# Patient Record
Sex: Female | Born: 1960 | Race: White | Hispanic: No | Smoking: Never smoker
Health system: Southern US, Community
[De-identification: ages and names within clinical notes are randomized; demographics above are authoritative.]

## PROBLEM LIST (undated history)

## (undated) DIAGNOSIS — R569 Unspecified convulsions: Secondary | ICD-10-CM

## (undated) HISTORY — PX: REPLACEMENT TOTAL KNEE BILATERAL: SUR1225

---

## 2020-08-13 ENCOUNTER — Other Ambulatory Visit (HOSPITAL_COMMUNITY): Payer: Self-pay | Admitting: *Deleted

## 2020-08-13 DIAGNOSIS — Z1231 Encounter for screening mammogram for malignant neoplasm of breast: Secondary | ICD-10-CM

## 2020-08-15 ENCOUNTER — Ambulatory Visit (HOSPITAL_COMMUNITY)
Admission: RE | Admit: 2020-08-15 | Discharge: 2020-08-15 | Disposition: A | Discharge status: Home / Self Care | Payer: Self-pay | Source: Ambulatory Visit | Attending: *Deleted | Admitting: *Deleted

## 2020-08-15 ENCOUNTER — Other Ambulatory Visit: Payer: Self-pay

## 2020-08-15 DIAGNOSIS — Z1231 Encounter for screening mammogram for malignant neoplasm of breast: Secondary | ICD-10-CM | POA: Insufficient documentation

## 2020-08-22 ENCOUNTER — Encounter: Payer: Self-pay | Admitting: Internal Medicine

## 2020-09-11 ENCOUNTER — Ambulatory Visit: Payer: Self-pay

## 2021-07-10 ENCOUNTER — Other Ambulatory Visit (HOSPITAL_COMMUNITY): Payer: Self-pay | Admitting: *Deleted

## 2021-07-24 ENCOUNTER — Other Ambulatory Visit (HOSPITAL_COMMUNITY): Payer: Self-pay | Admitting: Nurse Practitioner

## 2021-07-24 DIAGNOSIS — Z1231 Encounter for screening mammogram for malignant neoplasm of breast: Secondary | ICD-10-CM

## 2021-08-12 ENCOUNTER — Other Ambulatory Visit (HOSPITAL_COMMUNITY): Payer: Self-pay | Admitting: Nurse Practitioner

## 2021-08-19 ENCOUNTER — Ambulatory Visit (HOSPITAL_COMMUNITY)
Admission: RE | Admit: 2021-08-19 | Discharge: 2021-08-19 | Disposition: A | Discharge status: Home / Self Care | Payer: Commercial Managed Care - PPO | Source: Ambulatory Visit | Attending: Nurse Practitioner | Admitting: Nurse Practitioner

## 2021-08-19 ENCOUNTER — Other Ambulatory Visit: Payer: Self-pay

## 2021-08-19 DIAGNOSIS — Z1231 Encounter for screening mammogram for malignant neoplasm of breast: Secondary | ICD-10-CM | POA: Diagnosis not present

## 2021-09-02 ENCOUNTER — Encounter (HOSPITAL_COMMUNITY): Payer: Self-pay | Admitting: Emergency Medicine

## 2021-09-02 ENCOUNTER — Inpatient Hospital Stay (HOSPITAL_COMMUNITY)
Admission: EM | Admit: 2021-09-02 | Discharge: 2021-09-03 | DRG: 101 | Disposition: A | Discharge status: Home / Self Care | Payer: Commercial Managed Care - PPO | Attending: Internal Medicine | Admitting: Internal Medicine

## 2021-09-02 ENCOUNTER — Emergency Department (HOSPITAL_COMMUNITY): Payer: Commercial Managed Care - PPO

## 2021-09-02 ENCOUNTER — Inpatient Hospital Stay (HOSPITAL_COMMUNITY)
Admit: 2021-09-02 | Discharge: 2021-09-02 | Disposition: A | Discharge status: Home / Self Care | Payer: Commercial Managed Care - PPO

## 2021-09-02 DIAGNOSIS — Z20822 Contact with and (suspected) exposure to covid-19: Secondary | ICD-10-CM | POA: Diagnosis present

## 2021-09-02 DIAGNOSIS — R739 Hyperglycemia, unspecified: Secondary | ICD-10-CM | POA: Diagnosis present

## 2021-09-02 DIAGNOSIS — G40909 Epilepsy, unspecified, not intractable, without status epilepticus: Principal | ICD-10-CM | POA: Diagnosis present

## 2021-09-02 DIAGNOSIS — R569 Unspecified convulsions: Secondary | ICD-10-CM

## 2021-09-02 HISTORY — DX: Unspecified convulsions: R56.9

## 2021-09-02 LAB — HEMOGLOBIN A1C
Hgb A1c MFr Bld: 5.1 % (ref 4.8–5.6)
Mean Plasma Glucose: 99.67 mg/dL

## 2021-09-02 LAB — CBC WITH DIFFERENTIAL/PLATELET
Abs Immature Granulocytes: 0.1 10*3/uL — ABNORMAL HIGH (ref 0.00–0.07)
Basophils Absolute: 0 10*3/uL (ref 0.0–0.1)
Basophils Relative: 0 %
Eosinophils Absolute: 0 10*3/uL (ref 0.0–0.5)
Eosinophils Relative: 0 %
HCT: 42.1 % (ref 36.0–46.0)
Hemoglobin: 13.7 g/dL (ref 12.0–15.0)
Immature Granulocytes: 2 %
Lymphocytes Relative: 21 %
Lymphs Abs: 1.5 10*3/uL (ref 0.7–4.0)
MCH: 29 pg (ref 26.0–34.0)
MCHC: 32.5 g/dL (ref 30.0–36.0)
MCV: 89.2 fL (ref 80.0–100.0)
Monocytes Absolute: 0.4 10*3/uL (ref 0.1–1.0)
Monocytes Relative: 6 %
Neutro Abs: 4.8 10*3/uL (ref 1.7–7.7)
Neutrophils Relative %: 71 %
Platelets: 221 10*3/uL (ref 150–400)
RBC: 4.72 MIL/uL (ref 3.87–5.11)
RDW: 12.7 % (ref 11.5–15.5)
WBC: 6.8 10*3/uL (ref 4.0–10.5)
nRBC: 0 % (ref 0.0–0.2)

## 2021-09-02 LAB — COMPREHENSIVE METABOLIC PANEL
ALT: 15 U/L (ref 0–44)
AST: 23 U/L (ref 15–41)
Albumin: 4.5 g/dL (ref 3.5–5.0)
Alkaline Phosphatase: 66 U/L (ref 38–126)
Anion gap: 9 (ref 5–15)
BUN: 23 mg/dL (ref 8–23)
CO2: 22 mmol/L (ref 22–32)
Calcium: 8.9 mg/dL (ref 8.9–10.3)
Chloride: 107 mmol/L (ref 98–111)
Creatinine, Ser: 0.68 mg/dL (ref 0.44–1.00)
GFR, Estimated: >60 mL/min (ref >60)
Glucose, Bld: 178 mg/dL — ABNORMAL HIGH (ref 70–99)
Potassium: 3.8 mmol/L (ref 3.5–5.1)
Sodium: 138 mmol/L (ref 135–145)
Total Bilirubin: 0.4 mg/dL (ref 0.3–1.2)
Total Protein: 7.8 g/dL (ref 6.5–8.1)

## 2021-09-02 LAB — RESP PANEL BY RT-PCR (FLU A&B, COVID) ARPGX2
Influenza A by PCR: NEGATIVE
Influenza B by PCR: NEGATIVE
SARS Coronavirus 2 by RT PCR: NEGATIVE

## 2021-09-02 LAB — MAGNESIUM: Magnesium: 2.3 mg/dL (ref 1.7–2.4)

## 2021-09-02 MED ORDER — SODIUM CHLORIDE 0.9% FLUSH: 3.0000 mL | Freq: Two times a day (BID) | INTRAVENOUS | Status: DC

## 2021-09-02 MED ORDER — SODIUM CHLORIDE 0.9% FLUSH
3.0000 mL | Freq: Two times a day (BID) | INTRAVENOUS | Status: DC
  Administered 2021-09-03: 3 mL via INTRAVENOUS

## 2021-09-02 MED ORDER — LORAZEPAM 2 MG/ML IJ SOLN
2.0000 mg | Freq: Once | INTRAMUSCULAR | Status: AC
Start: 2021-09-02 — End: 2021-09-02

## 2021-09-02 MED ORDER — POLYETHYLENE GLYCOL 3350 17 G PO PACK: 17.0000 g | PACK | Freq: Every day | ORAL | Status: DC | PRN

## 2021-09-02 MED ORDER — SODIUM CHLORIDE 0.9 % IV SOLN: 4000.0000 mg | Freq: Once | INTRAVENOUS | Status: DC

## 2021-09-02 MED ORDER — LEVETIRACETAM IN NACL 500 MG/100ML IV SOLN
500.0000 mg | Freq: Two times a day (BID) | INTRAVENOUS | Status: DC
  Administered 2021-09-02 – 2021-09-03 (×2): 500 mg via INTRAVENOUS
  Filled 2021-09-02 (×2): qty 100

## 2021-09-02 MED ORDER — SODIUM CHLORIDE 0.9 % IV SOLN: 250.0000 mL | INTRAVENOUS | Status: DC | PRN

## 2021-09-02 MED ORDER — LORAZEPAM 2 MG/ML IJ SOLN
INTRAMUSCULAR | Status: AC
  Administered 2021-09-02: 2 mg via INTRAVENOUS
  Filled 2021-09-02: qty 1

## 2021-09-02 MED ORDER — SODIUM CHLORIDE 0.9% FLUSH: 3.0000 mL | INTRAVENOUS | Status: DC | PRN

## 2021-09-02 MED ORDER — ONDANSETRON HCL 4 MG PO TABS: 4.0000 mg | ORAL_TABLET | Freq: Four times a day (QID) | ORAL | Status: DC | PRN

## 2021-09-02 MED ORDER — SODIUM CHLORIDE 0.9 % IV SOLN: INTRAVENOUS | Status: DC

## 2021-09-02 MED ORDER — HEPARIN SODIUM (PORCINE) 5000 UNIT/ML IJ SOLN
5000.0000 [IU] | Freq: Three times a day (TID) | INTRAMUSCULAR | Status: DC
  Administered 2021-09-02 – 2021-09-03 (×2): 5000 [IU] via SUBCUTANEOUS
  Filled 2021-09-02 (×2): qty 1

## 2021-09-02 MED ORDER — BISACODYL 10 MG RE SUPP: 10.0000 mg | Freq: Every day | RECTAL | Status: DC | PRN

## 2021-09-02 MED ORDER — TRAZODONE HCL 50 MG PO TABS: 50.0000 mg | ORAL_TABLET | Freq: Every evening | ORAL | Status: DC | PRN

## 2021-09-02 MED ORDER — LORAZEPAM 2 MG/ML IJ SOLN
1.0000 mg | INTRAMUSCULAR | Status: DC | PRN
Start: 2021-09-02 — End: 2021-09-03

## 2021-09-02 MED ORDER — ACETAMINOPHEN 325 MG PO TABS
650.0000 mg | ORAL_TABLET | Freq: Four times a day (QID) | ORAL | Status: DC | PRN
  Administered 2021-09-03: 650 mg via ORAL
  Filled 2021-09-02: qty 2

## 2021-09-02 MED ORDER — ONDANSETRON HCL 4 MG/2ML IJ SOLN
4.0000 mg | Freq: Four times a day (QID) | INTRAMUSCULAR | Status: DC | PRN
  Administered 2021-09-02: 4 mg via INTRAVENOUS
  Filled 2021-09-02: qty 2

## 2021-09-02 MED ORDER — ACETAMINOPHEN 650 MG RE SUPP: 650.0000 mg | Freq: Four times a day (QID) | RECTAL | Status: DC | PRN

## 2021-09-02 MED ORDER — LEVETIRACETAM IN NACL 1000 MG/100ML IV SOLN
1000.0000 mg | INTRAVENOUS | Status: AC
  Administered 2021-09-02 (×4): 1000 mg via INTRAVENOUS
  Filled 2021-09-02 (×4): qty 100

## 2021-09-02 NOTE — ED Notes (Signed)
Pt daughter came out followed by a scream from the pt.This RN witnessed pt seizing, foaming at the mouth, jerking movements, and a drop in O2 from 98 on RA before event to 78 on RA. Suction pt mouth. Applied O2 via nasal cannula to pt and  O2 sat went up to 100%. MD/PA--C made aware ?

## 2021-09-02 NOTE — H&P (Signed)
?                                                                                           ? ? ? ? Patient Demographics:  ? ? ?Dominique Reynolds, is a 61 y.o. female  MRN: [id number]   DOB - 06-21-1960 ? ?Admit Date - 09/02/2021 ? ?Outpatient Primary MD for the patient is Muse, Noel Journey., PA-C ? ? Assessment & Plan:  ? ?Assessment and Plan: ? ?1)Seizures--recurrent seizures prior to arrival and another tonic-clonic seizure episode witnessed in the ED by EDP and ED staff, responded well to lorazepam ?-EEG completed report pending ?-EDP Discussed with on-call neurologist Dr. Su Monks ?-Recommends transfer to [facility] for further neurology evaluation ?-Patient has been loaded with IV Keppra ?-May use lorazepam IV as needed ? ?2)Hyperglycemia--- may be reactive in the setting of seizure ?-Check A1c and fingersticks ? ?3)Social/Ethics-Discussed with  daughter Dominique Reynolds at bedside , questions answered, patient is a full code ? ?Disposition/Need for in-Hospital Stay- patient unable to be discharged at this time due to -recurrent seizures requiring further neurology evaluation and IV Keppra/IV lorazepam ? ?Status is: Inpatient ? ?Remains inpatient appropriate because:  ? ?Dispo: The patient is from: Home ?             Anticipated d/c is to: Home ?             Anticipated d/c date is: 2 days ?             Patient currently is not medically stable to d/c. ?Barriers: Not Clinically Stable-  ? ?With History of - ?Reviewed by me ? ?Past Medical History:  ?Diagnosis Date  ? Seizures ([place])   ?   ? ?History reviewed. No pertinent surgical history. ? ? ?Chief Complaint  ?Patient presents with  ? Seizures  ?  ? ? HPI:  ? ? Dominique Reynolds  is a 61 y.o. female with prior history of seizure episode about 5 years ago while in [place]--presented by EMS due to concerns about recurrent seizures today ?recurrent seizures prior to arrival and another  tonic-clonic seizure episode witnessed in the ED by EDP and ED staff, responded well to lorazepam ?-EEG completed report pending ?-EDP Discussed with on-call neurologist Dr. Su Monks ?-Recommends transfer to [facility] for further neurology evaluation ?-Patient has been loaded with IV Keppra ?-May use lorazepam IV as needed ?Additional history obtained from  daughter Dominique Reynolds at bedside  ?-CT head and CT C-spine without acute findings ?-CBC WNL ?CMP WNL except for glucose of 178, please note that creatinine and LFTs are not elevated, potassium and sodium WNL ?--Chest x-ray and x-rays of the right and left knees without acute findings, pelvic x-rays without acute fractures ? ? Review of systems:  ?  ?In addition to the HPI above,  ? ?A full Review of  Systems was done, all other systems reviewed are negative except as noted above in HPI , . ? ? ? Social History:  ?Reviewed by me ? ?  ?Social History  ? ?Tobacco Use  ? Smoking status: Not on file  ? Smokeless  tobacco: Not on file  ?Substance Use Topics  ? Alcohol use: Not on file  ? ? ? Family History :  ?Reviewed by me ? ?HTN ? ? Home Medications:  ? ?Prior to Admission medications   ?Not on File  ? ? ? Allergies:  ? ? Not on File ? ? Physical Exam:  ? ?Vitals ? ?Blood pressure 101/68, pulse 81, temperature 98.1 ?F (36.7 ?C), temperature source Axillary, resp. rate 16, weight 63.2 kg, SpO2 98 %. ? ?Physical Examination: General appearance -sleepy after seizures and IV Ativan ?Mental status -wakes up to acknowledge and answer simple questions otherwise lethargic ?Eyes - sclera anicteric ?Neck - supple, no JVD elevation , ?Chest - clear  to auscultation bilaterally, symmetrical air movement,  ?Heart - S1 and S2 normal, regular  ?Abdomen - soft, nontender, nondistended, +BS ?Neurological - screening mental status exam normal, neck supple without rigidity, cranial nerves II through XII intact, DTR's normal and symmetric ?Extremities - no pedal edema noted,  intact peripheral pulses  ?Skin - warm, dry ? ? Data Review:  ? ? CBC ?Recent Labs  ?Lab 09/02/21 ?1033  ?WBC 6.8  ?HGB 13.7  ?HCT 42.1  ?PLT 221  ?MCV 89.2  ?MCH 29.0  ?MCHC 32.5  ?RDW 12.7  ?LYMPHSABS 1.5  ?MONOABS 0.4  ?EOSABS 0.0  ?BASOSABS 0.0  ? ?------------------------------------------------------------------------------------------------------------------ ? ?Chemistries  ?Recent Labs  ?Lab 09/02/21 ?1033  ?NA 138  ?K 3.8  ?CL 107  ?CO2 22  ?GLUCOSE 178*  ?BUN 23  ?CREATININE 0.68  ?CALCIUM 8.9  ?MG 2.3  ?AST 23  ?ALT 15  ?ALKPHOS 66  ?BILITOT 0.4  ? ?------------------------------------------------------------------------------------------ ? Imaging Results:  ? ? DG Chest 1 View ? ?Result Date: 09/02/2021 ?CLINICAL DATA:  Seizures EXAM: CHEST  1 VIEW COMPARISON:  None. FINDINGS: The heart size and mediastinal contours are within normal limits. Both lungs are clear. The visualized skeletal structures are unremarkable. IMPRESSION: No active disease. Electronically Signed   By: Yetta Glassman M.D.   On: 09/02/2021 11:23  ? ?DG Pelvis 1-2 Views ? ?Result Date: 09/02/2021 ?CLINICAL DATA:  Seizure. EXAM: PELVIS - 1-2 VIEW COMPARISON:  None. FINDINGS: There is diffuse decreased bone mineralization. Mild pubic symphysis joint space narrowing and superior degenerative osteophytosis. The bilateral sacroiliac joint spaces are maintained. The bilateral femoroacetabular joint spaces are maintained. Small well corticated ossicle just inferior to the right ischium, possible chronic enthesopathic change at the common hamstring tendon origin. No acute fracture is seen.  No dislocation. IMPRESSION: Mild pubic symphysis osteoarthritis.  No acute fracture. Electronically Signed   By: Yvonne Kendall M.D.   On: 09/02/2021 11:27  ? ?CT Head Wo Contrast ? ?Result Date: 09/02/2021 ?CLINICAL DATA:  Provided history: Seizure, new onset, history of trauma. Poly trauma, blunt. EXAM: CT HEAD WITHOUT CONTRAST CT CERVICAL SPINE WITHOUT  CONTRAST TECHNIQUE: Multidetector CT imaging of the head and cervical spine was performed following the standard protocol without intravenous contrast. Multiplanar CT image reconstructions of the cervical spine were also generated. RADIATION DOSE REDUCTION: This exam was performed according to the departmental dose-optimization program which includes automated exposure control, adjustment of the mA and/or kV according to patient size and/or use of iterative reconstruction technique. COMPARISON:  No pertinent prior exams available for comparison. FINDINGS: CT HEAD FINDINGS Brain: No age advanced or lobar predominant parenchymal atrophy. Patchy and ill-defined hypoattenuation within the cerebral white matter, greatest within the right frontoparietal lobes. Partially empty sella turcica. There is no acute intracranial hemorrhage. No demarcated  cortical infarct. No extra-axial fluid collection. No evidence of an intracranial mass. No midline shift. Vascular: No hyperdense vessel. Skull: Normal. Negative for fracture or focal lesion. Sinuses/Orbits: Visualized orbits show no acute finding. 9 mm mucous retention cyst within the left maxillary sinus. CT CERVICAL SPINE FINDINGS Alignment: Straightening of the expected cervical lordosis. No significant spondylolisthesis. Skull base and vertebrae: The basion-dental and atlanto-dental intervals are maintained.No evidence of acute fracture to the cervical spine. Congenital nonunion of the posterior arch of C1. Soft tissues and spinal canal: No prevertebral fluid or swelling. No visible canal hematoma. Disc levels: Cervical spondylosis, greatest at C5-C6. At this level, there is advanced disc space narrowing with a disc bulge, endplate spurring and bilateral uncovertebral hypertrophy. No appreciable high-grade spinal canal stenosis. Mild left C5-C6 bony neural foraminal narrowing. Upper chest: No consolidation within the imaged lung apices. No visible pneumothorax. IMPRESSION:  CT head: 1. No evidence of acute intracranial hemorrhage, acute cortical infarct or intracranial mass. 2. Mild nonspecific cerebral white matter disease. 3. 9 mm mucous retention cyst within the left maxillary

## 2021-09-02 NOTE — ED Provider Notes (Signed)
?[place] EMERGENCY DEPARTMENT ?Provider Note ? ? ?CSN: [id number] ?Arrival date & time: 09/02/21  8329 ? ?  ? ?History ? ?No chief complaint on file. ? ? ?Dominique Reynolds is a 61 y.o. female. ? ?HPI ? ?61 year old female with past medical history of seizures presents emergency department with concern for fall/seizure activity.  Patient is accompanied by her daughter who is the main history giver as the patient is nonverbal at this time.  Patient's lives with her daughter.  The daughter states that she was normal this morning, ate breakfast and left for work.  It is reported that at work she fell down onto her knees and back onto the floor.  Hitting her head, presumed loss of consciousness.  There was reported whole body shaking and seizure-like activity.  EMS reported that the patient was postictal.  She arrives with a c-collar in place.  Her eyes localize to my face and voice but she does not respond verbally.  Every now and then she is having startle like movement of her upper body/extremities but no noted seizure activity.  No obvious signs of trauma, she is moving all 4 extremities. ? ?Home Medications ?Prior to Admission medications   ?Not on File  ?   ? ?Allergies    ?Patient has no allergy information on record.   ? ?Review of Systems   ?Review of Systems  ?Unable to perform ROS: Patient nonverbal  ? ?Physical Exam ?Updated Vital Signs ?BP 120/64   Pulse 90   Resp 19   Wt 63.2 kg   SpO2 100%  ?Physical Exam ?Vitals and nursing note reviewed.  ?Constitutional:   ?   Appearance: Normal appearance.  ?HENT:  ?   Head: Normocephalic.  ?   Mouth/Throat:  ?   Mouth: Mucous membranes are moist.  ?Eyes:  ?   Extraocular Movements: Extraocular movements intact.  ?   Pupils: Pupils are equal, round, and reactive to light.  ?Neck:  ?   Comments: C-collar in place ?Cardiovascular:  ?   Rate and Rhythm: Normal rate.  ?Pulmonary:  ?   Effort: Pulmonary effort is normal. No respiratory distress.  ?Abdominal:  ?    Palpations: Abdomen is soft.  ?   Tenderness: There is no abdominal tenderness.  ?Musculoskeletal:     ?   General: No swelling or deformity.  ?Skin: ?   General: Skin is warm.  ?Neurological:  ?   Mental Status: She is alert.  ?   Comments: Eyes open, alert, localizes to my voice, nonverbal, moving all 4 extremities, startle like jumping motion in the bilateral upper extremities, no rigidity  ?Psychiatric:     ?   Mood and Affect: Mood normal.  ? ? ?ED Results / Procedures / Treatments   ?Labs ?(all labs ordered are listed, but only abnormal results are displayed) ?Labs Reviewed  ?CBC WITH DIFFERENTIAL/PLATELET - Abnormal; Notable for the following components:  ?    Result Value  ? Abs Immature Granulocytes 0.10 (*)   ? All other components within normal limits  ?COMPREHENSIVE METABOLIC PANEL - Abnormal; Notable for the following components:  ? Glucose, Bld 178 (*)   ? All other components within normal limits  ?RESP PANEL BY RT-PCR (FLU A&B, COVID) ARPGX2  ?MAGNESIUM  ? ? ?EKG ?EKG Interpretation ? ?Date/Time:  Monday September 02 2021 08:50:05 EDT ?Ventricular Rate:  91 ?PR Interval:  153 ?QRS Duration: 98 ?QT Interval:  374 ?QTC Calculation: 461 ?R Axis:   55 ?Text  Interpretation: Sinus rhythm Confirmed by Coralee Pesa [phone]) on 09/02/2021 10:27:10 AM ? ?Radiology ?DG Chest 1 View ? ?Result Date: 09/02/2021 ?CLINICAL DATA:  Seizures EXAM: CHEST  1 VIEW COMPARISON:  None. FINDINGS: The heart size and mediastinal contours are within normal limits. Both lungs are clear. The visualized skeletal structures are unremarkable. IMPRESSION: No active disease. Electronically Signed   By: Allegra Lai M.D.   On: 09/02/2021 11:23  ? ?DG Pelvis 1-2 Views ? ?Result Date: 09/02/2021 ?CLINICAL DATA:  Seizure. EXAM: PELVIS - 1-2 VIEW COMPARISON:  None. FINDINGS: There is diffuse decreased bone mineralization. Mild pubic symphysis joint space narrowing and superior degenerative osteophytosis. The bilateral sacroiliac joint spaces  are maintained. The bilateral femoroacetabular joint spaces are maintained. Small well corticated ossicle just inferior to the right ischium, possible chronic enthesopathic change at the common hamstring tendon origin. No acute fracture is seen.  No dislocation. IMPRESSION: Mild pubic symphysis osteoarthritis.  No acute fracture. Electronically Signed   By: Neita Garnet M.D.   On: 09/02/2021 11:27  ? ?CT Head Wo Contrast ? ?Result Date: 09/02/2021 ?CLINICAL DATA:  Provided history: Seizure, new onset, history of trauma. Poly trauma, blunt. EXAM: CT HEAD WITHOUT CONTRAST CT CERVICAL SPINE WITHOUT CONTRAST TECHNIQUE: Multidetector CT imaging of the head and cervical spine was performed following the standard protocol without intravenous contrast. Multiplanar CT image reconstructions of the cervical spine were also generated. RADIATION DOSE REDUCTION: This exam was performed according to the departmental dose-optimization program which includes automated exposure control, adjustment of the mA and/or kV according to patient size and/or use of iterative reconstruction technique. COMPARISON:  No pertinent prior exams available for comparison. FINDINGS: CT HEAD FINDINGS Brain: No age advanced or lobar predominant parenchymal atrophy. Patchy and ill-defined hypoattenuation within the cerebral white matter, greatest within the right frontoparietal lobes. Partially empty sella turcica. There is no acute intracranial hemorrhage. No demarcated cortical infarct. No extra-axial fluid collection. No evidence of an intracranial mass. No midline shift. Vascular: No hyperdense vessel. Skull: Normal. Negative for fracture or focal lesion. Sinuses/Orbits: Visualized orbits show no acute finding. 9 mm mucous retention cyst within the left maxillary sinus. CT CERVICAL SPINE FINDINGS Alignment: Straightening of the expected cervical lordosis. No significant spondylolisthesis. Skull base and vertebrae: The basion-dental and atlanto-dental  intervals are maintained.No evidence of acute fracture to the cervical spine. Congenital nonunion of the posterior arch of C1. Soft tissues and spinal canal: No prevertebral fluid or swelling. No visible canal hematoma. Disc levels: Cervical spondylosis, greatest at C5-C6. At this level, there is advanced disc space narrowing with a disc bulge, endplate spurring and bilateral uncovertebral hypertrophy. No appreciable high-grade spinal canal stenosis. Mild left C5-C6 bony neural foraminal narrowing. Upper chest: No consolidation within the imaged lung apices. No visible pneumothorax. IMPRESSION: CT head: 1. No evidence of acute intracranial hemorrhage, acute cortical infarct or intracranial mass. 2. Mild nonspecific cerebral white matter disease. 3. 9 mm mucous retention cyst within the left maxillary sinus. CT cervical spine: 1. No evidence of acute fracture to the cervical spine. 2. Nonspecific straightening of the expected cervical lordosis. 3. Cervical spondylosis, greatest at C5-C6. Electronically Signed   By: Jackey Loge D.O.   On: 09/02/2021 11:01  ? ?CT Cervical Spine Wo Contrast ? ?Result Date: 09/02/2021 ?CLINICAL DATA:  Provided history: Seizure, new onset, history of trauma. Poly trauma, blunt. EXAM: CT HEAD WITHOUT CONTRAST CT CERVICAL SPINE WITHOUT CONTRAST TECHNIQUE: Multidetector CT imaging of the head and cervical spine was performed following the  standard protocol without intravenous contrast. Multiplanar CT image reconstructions of the cervical spine were also generated. RADIATION DOSE REDUCTION: This exam was performed according to the departmental dose-optimization program which includes automated exposure control, adjustment of the mA and/or kV according to patient size and/or use of iterative reconstruction technique. COMPARISON:  No pertinent prior exams available for comparison. FINDINGS: CT HEAD FINDINGS Brain: No age advanced or lobar predominant parenchymal atrophy. Patchy and ill-defined  hypoattenuation within the cerebral white matter, greatest within the right frontoparietal lobes. Partially empty sella turcica. There is no acute intracranial hemorrhage. No demarcated cortical infarct. No ex

## 2021-09-02 NOTE — ED Triage Notes (Signed)
Pt arrived via [facility] c/o seizure. Per EMS, pt is postictal. Hx of seizures, however, per EMS, was told she treats with Herbal supplements.  ?

## 2021-09-02 NOTE — Progress Notes (Signed)
EEG complete - results pending 

## 2021-09-02 NOTE — Procedures (Signed)
Patient Name: Dominique Reynolds  ?MRN: [id number]  ?Epilepsy Attending: Charlsie Quest  ?Referring Physician/Provider: Shon Hale, MD ?Date: 09/02/2021 ?Duration: 22.19 mins ? ?Patient history: 61 year old female presents emergency department after reported fall, head injury and seizure-like activity. EEG to evaluate fors seizure.  ? ?Level of alertness: asleep, lethargic ? ?AEDs during EEG study: LEV ? ?Technical aspects: This EEG study was done with scalp electrodes positioned according to the 10-20 International system of electrode placement. Electrical activity was acquired at a sampling rate of 500Hz  and reviewed with a high frequency filter of 70Hz  and a low frequency filter of 1Hz . EEG data were recorded continuously and digitally stored.  ? ?Description: No clear posterior dominant rhythm was seen. Sleep was characterized by vertex waves, sleep spindles (12 to 14 Hz), maximal frontocentral region.  EEG showed continuous generalized 3 to 6 Hz theta-delta slowing. Hyperventilation and photic stimulation were not performed.    ? ?ABNORMALITY ?- Continuous slow, generalized ? ?IMPRESSION: ?This study is suggestive of moderate diffuse encephalopathy, nonspecific etiology. No seizures or definite epileptiform discharges were seen throughout the recording. ? ?  ? ?

## 2021-09-02 NOTE — ED Notes (Signed)
EEG being performed at this time °

## 2021-09-03 ENCOUNTER — Inpatient Hospital Stay (HOSPITAL_COMMUNITY): Payer: Commercial Managed Care - PPO

## 2021-09-03 ENCOUNTER — Other Ambulatory Visit: Payer: Self-pay | Admitting: Internal Medicine

## 2021-09-03 LAB — BASIC METABOLIC PANEL
Anion gap: 5 (ref 5–15)
BUN: 10 mg/dL (ref 8–23)
CO2: 23 mmol/L (ref 22–32)
Calcium: 8.7 mg/dL — ABNORMAL LOW (ref 8.9–10.3)
Chloride: 111 mmol/L (ref 98–111)
Creatinine, Ser: 0.5 mg/dL (ref 0.44–1.00)
GFR, Estimated: >60 mL/min (ref >60)
Glucose, Bld: 95 mg/dL (ref 70–99)
Potassium: 3.7 mmol/L (ref 3.5–5.1)
Sodium: 139 mmol/L (ref 135–145)

## 2021-09-03 LAB — CBC
HCT: 37.8 % (ref 36.0–46.0)
Hemoglobin: 12.7 g/dL (ref 12.0–15.0)
MCH: 29.1 pg (ref 26.0–34.0)
MCHC: 33.6 g/dL (ref 30.0–36.0)
MCV: 86.5 fL (ref 80.0–100.0)
Platelets: 225 10*3/uL (ref 150–400)
RBC: 4.37 MIL/uL (ref 3.87–5.11)
RDW: 12.5 % (ref 11.5–15.5)
WBC: 8 10*3/uL (ref 4.0–10.5)
nRBC: 0 % (ref 0.0–0.2)

## 2021-09-03 LAB — GLUCOSE, CAPILLARY
Glucose-Capillary: 102 mg/dL — ABNORMAL HIGH (ref 70–99)
Glucose-Capillary: 82 mg/dL (ref 70–99)

## 2021-09-03 LAB — HIV ANTIBODY (ROUTINE TESTING W REFLEX): HIV Screen 4th Generation wRfx: NONREACTIVE

## 2021-09-03 MED ORDER — LEVETIRACETAM 500 MG PO TABS: 500.0000 mg | ORAL_TABLET | Freq: Two times a day (BID) | ORAL | 2 refills | Status: DC

## 2021-09-03 NOTE — Progress Notes (Signed)
Pt complaining of pain in right knee - pt states the pain is progressively getting worse.  MD notified.  No changes to discharge home - PRN Tylenol as outpatient.  Will continue to monitor until discharge.   ?

## 2021-09-03 NOTE — Consult Note (Signed)
Neurology Consultation ?Reason for Consult: Altered mental status after seizure ?Referring Physician: Mariea Clonts, C ? ?CC: Altered mental status after seizure ? ?History is obtained from: Chart review, patient ? ?HPI: Dominique Reynolds is a 61 y.o. female with a history of a single previous seizure who presents with least two episodes of seizure-like activity today.  She lives with her daughter, and was apparently normal this morning.  Patient reports that she does not remember anything leading up to the event.  Initially, she had no verbal responses, but was moving all four extremities.  She subsequently had a repeat seizure in the emergency department that required 2 mg of IV Ativan.  She remained persistently confused after the second seizure and therefore plan was to transfer to [facility] for continuous EEG.  In the interim, she had an EEG done at [facility], which was negative.  ? ?She reports no memory of today's events.  She denies any antecedent symptoms including fevers, headache, or other illness. ? ?ROS: A 14 point ROS was performed and is negative except as noted in the HPI.  ?Past Medical History:  ?Diagnosis Date  ? Seizures ([facility])   ? ? ? ?History reviewed. No pertinent family history. ? ? ?Social History:  has no history on file for tobacco use, alcohol use, and drug use. ? ? ?Exam: ?Current vital signs: ?BP 111/84 (BP Location: Right Arm)   Pulse 89   Temp 98.9 ?F (37.2 ?C) (Oral)   Resp 18   Wt 63.2 kg   SpO2 99%  ?Vital signs in last 24 hours: ?Temp:  [98.1 ?F (36.7 ?C)-98.9 ?F (37.2 ?C)] 98.9 ?F (37.2 ?C) (04/10 2346) ?Pulse Rate:  [77-106] 89 (04/10 2346) ?Resp:  [13-19] 18 (04/10 2346) ?BP: (101-130)/(64-84) 111/84 (04/10 2346) ?SpO2:  [98 %-100 %] 99 % (04/10 2346) ?Weight:  [63.2 kg] 63.2 kg (04/10 0844) ? ? ?Physical Exam  ?Constitutional: Appears well-developed and well-nourished.  ?Psych: Affect appropriate to situation ?Eyes: No scleral injection ?HENT: No OP obstruction ?MSK: no joint  deformities.  ?Cardiovascular: Normal rate and regular rhythm.  ?Respiratory: Effort normal, non-labored breathing ?GI: Soft.  No distension. There is no tenderness.  ?Skin: WDI ? ?Neuro: ?Mental Status: ?Patient is somnolent but easily rousable ot minor stimulation.  ?Patient is able to give a clear and coherent history. ?No signs of aphasia or neglect ?She gives the year as 2021, otherwise oriented.  ?Cranial Nerves: ?II: Visual Fields are full. Pupils are equal, round, and reactive to light.   ?III,IV, VI: EOMI without ptosis or diploplia.  ?V: Facial sensation is symmetric to temperature ?VII: Facial movement is symmetric.  ?VIII: hearing is intact to voice ?X: Uvula elevates symmetrically ?XI: Shoulder shrug is symmetric. ?XII: tongue is midline without atrophy or fasciculations.  ?Motor: ?Tone is normal. Bulk is normal. 5/5 strength was present in all four extremities, though she gives poor effort in the lower extremities ?Sensory: ?Sensation is symmetric to light touch and temperature in the arms and legs. ?Cerebellar: ?Poor cooperation with formal testing, but no definite ataxia ? ? ? ? ? ?I have reviewed labs in epic and the results pertinent to this consultation are: ?Creatinine 0.68 ? ?I have reviewed the images obtained: CT head-negative ? ?Impression: 61 year old female with a history of single previous seizure who presents with second and third lifetime seizures happening in close proximity.  With her continued confusion she was admitted for observation, but she does not seem to be having any signs of ongoing seizure  activity at this time.  She will need to be started on antiepileptic medication and I would favor getting an MRI as well. ? ?Recommendations: ?1) Keppra 500 mg twice daily ?2) MRI brain ?3) neurology will follow ? ? ?Ritta Slot, MD ?[facility] ?[phone] ? ?If 7pm- 7am, please page neurology on call as listed in AMION. ? ?

## 2021-09-03 NOTE — Progress Notes (Signed)
Pt requesting to eat lunch prior to discharge.  Pts daughter and transportation in room.  Will continue to monitor until pt discharges. ?

## 2021-09-03 NOTE — Discharge Summary (Signed)
?Physician Discharge Summary ?  ?Patient: Dominique Reynolds MRN: [id number] DOB: 07-Jun-1960  ?Admit date:     09/02/2021  ?Discharge date: 09/03/21  ?Discharge Physician: Rebekah Chesterfield Takya Vandivier  ? ?PCP: Tylene Fantasia., PA-C  ? ?Recommendations at discharge:  ? ?Follow-up with your primary care physician in 1 week. ?Follow-up with neurology as outpatient in 4 to 6 weeks.  Ambulatory referral to [facility] neurology has been made. ? ?Discharge Diagnoses: ?Principal Problem: ?  Seizure (HCC) ? ?Resolved Problems: ?  * No resolved hospital problems. * ? ?Hospital Course: ?Dominique Reynolds is a 61 y.o. female with history of seizures presented to hospital with 2 episodes of seizure-like activity at [facility]..  Patient did not remember anything leading up to the event.  Initially she had no appropriate verbal response.  But then she had repeated seizures in the ED and the pain as well and remained persistently confused was transferred to [facility] for continuous EEG and neurology follow-up.   EEG done at [facility] was negative. ?  ?Assessment and Plan: ? ?Recurrent seizures ?Patient had seizure episodes prior to arrival and also witnessed in the ED.  Neurology on board.  Patient was loaded with IV Keppra.  Neurology recommends Keppra 500 mg twice a day on discharge.Marland Kitchen  MRI of the brain was negative for acute findings.  Communicated with neurology.  Patient is stable for disposition home today will need to follow-up with neurology as outpatient in 4 to 6 weeks.  Ambulatory referral to neurology has been made. ? ?Hyperglycemia--- likely reactive.  Latest hemoglobin A1c of 5.1.   ? ?Consultants: Neurology ? ?Procedures performed: EEG ?Disposition: Home ?Diet recommendation:  ?Discharge Diet Orders (From admission, onward)  ? ?  Start     Ordered  ? 09/03/21 0000  Diet general       ? 09/03/21 1251  ? ?  ?  ? ?  ? ?Regular diet ?DISCHARGE MEDICATION: ?Allergies as of 09/03/2021   ?Not on File ?  ? ?  ?Medication List   ?  ? ?TAKE these medications   ? ?levETIRAcetam 500 MG tablet ?Commonly known as: Keppra ?Take 1 tablet (500 mg total) by mouth 2 (two) times daily. ?  ? ?  ? ?Subjective ?Today, patient was seen and examined at bedside.  Denies any headache dizziness lightheadedness.  No further seizures reported.  Seen by neurology. ? ?Discharge Exam: ?Filed Weights  ? 09/02/21 0844  ?Weight: 63.2 kg  ? ? ?  09/03/2021  ?  8:10 AM 09/03/2021  ?  3:45 AM 09/02/2021  ? 11:46 PM  ?Vitals with BMI  ?Systolic 110 108 409  ?Diastolic 75 76 84  ?Pulse 92 93 89  ?  ?General:  Average built, not in obvious distress ?HENT:   No scleral pallor or icterus noted. Oral mucosa is moist.  ?Chest:  Clear breath sounds.  Diminished breath sounds bilaterally. No crackles or wheezes.  ?CVS: S1 &S2 heard. No murmur.  Regular rate and rhythm. ?Abdomen: Soft, nontender, nondistended.  Bowel sounds are heard.   ?Extremities: No cyanosis, clubbing or edema.  Peripheral pulses are palpable. ?Psych: Alert, awake and oriented, normal mood ?CNS:  No cranial nerve deficits.  Power equal in all extremities.   ?Skin: Warm and dry.  No rashes noted. ? ?Condition at discharge: good ? ?The results of significant diagnostics from this hospitalization (including imaging, microbiology, ancillary and laboratory) are listed below for reference.  ? ?Imaging Studies: ?DG Chest 1 View ? ?  Result Date: 09/02/2021 ?CLINICAL DATA:  Seizures EXAM: CHEST  1 VIEW COMPARISON:  None. FINDINGS: The heart size and mediastinal contours are within normal limits. Both lungs are clear. The visualized skeletal structures are unremarkable. IMPRESSION: No active disease. Electronically Signed   By: Allegra Lai M.D.   On: 09/02/2021 11:23  ? ?DG Pelvis 1-2 Views ? ?Result Date: 09/02/2021 ?CLINICAL DATA:  Seizure. EXAM: PELVIS - 1-2 VIEW COMPARISON:  None. FINDINGS: There is diffuse decreased bone mineralization. Mild pubic symphysis joint space narrowing and superior degenerative  osteophytosis. The bilateral sacroiliac joint spaces are maintained. The bilateral femoroacetabular joint spaces are maintained. Small well corticated ossicle just inferior to the right ischium, possible chronic enthesopathic change at the common hamstring tendon origin. No acute fracture is seen.  No dislocation. IMPRESSION: Mild pubic symphysis osteoarthritis.  No acute fracture. Electronically Signed   By: Neita Garnet M.D.   On: 09/02/2021 11:27  ? ?CT Head Wo Contrast ? ?Result Date: 09/02/2021 ?CLINICAL DATA:  Provided history: Seizure, new onset, history of trauma. Poly trauma, blunt. EXAM: CT HEAD WITHOUT CONTRAST CT CERVICAL SPINE WITHOUT CONTRAST TECHNIQUE: Multidetector CT imaging of the head and cervical spine was performed following the standard protocol without intravenous contrast. Multiplanar CT image reconstructions of the cervical spine were also generated. RADIATION DOSE REDUCTION: This exam was performed according to the departmental dose-optimization program which includes automated exposure control, adjustment of the mA and/or kV according to patient size and/or use of iterative reconstruction technique. COMPARISON:  No pertinent prior exams available for comparison. FINDINGS: CT HEAD FINDINGS Brain: No age advanced or lobar predominant parenchymal atrophy. Patchy and ill-defined hypoattenuation within the cerebral white matter, greatest within the right frontoparietal lobes. Partially empty sella turcica. There is no acute intracranial hemorrhage. No demarcated cortical infarct. No extra-axial fluid collection. No evidence of an intracranial mass. No midline shift. Vascular: No hyperdense vessel. Skull: Normal. Negative for fracture or focal lesion. Sinuses/Orbits: Visualized orbits show no acute finding. 9 mm mucous retention cyst within the left maxillary sinus. CT CERVICAL SPINE FINDINGS Alignment: Straightening of the expected cervical lordosis. No significant spondylolisthesis. Skull base  and vertebrae: The basion-dental and atlanto-dental intervals are maintained.No evidence of acute fracture to the cervical spine. Congenital nonunion of the posterior arch of C1. Soft tissues and spinal canal: No prevertebral fluid or swelling. No visible canal hematoma. Disc levels: Cervical spondylosis, greatest at C5-C6. At this level, there is advanced disc space narrowing with a disc bulge, endplate spurring and bilateral uncovertebral hypertrophy. No appreciable high-grade spinal canal stenosis. Mild left C5-C6 bony neural foraminal narrowing. Upper chest: No consolidation within the imaged lung apices. No visible pneumothorax. IMPRESSION: CT head: 1. No evidence of acute intracranial hemorrhage, acute cortical infarct or intracranial mass. 2. Mild nonspecific cerebral white matter disease. 3. 9 mm mucous retention cyst within the left maxillary sinus. CT cervical spine: 1. No evidence of acute fracture to the cervical spine. 2. Nonspecific straightening of the expected cervical lordosis. 3. Cervical spondylosis, greatest at C5-C6. Electronically Signed   By: Jackey Loge D.O.   On: 09/02/2021 11:01  ? ?CT Cervical Spine Wo Contrast ? ?Result Date: 09/02/2021 ?CLINICAL DATA:  Provided history: Seizure, new onset, history of trauma. Poly trauma, blunt. EXAM: CT HEAD WITHOUT CONTRAST CT CERVICAL SPINE WITHOUT CONTRAST TECHNIQUE: Multidetector CT imaging of the head and cervical spine was performed following the standard protocol without intravenous contrast. Multiplanar CT image reconstructions of the cervical spine were also generated. RADIATION DOSE REDUCTION:  This exam was performed according to the departmental dose-optimization program which includes automated exposure control, adjustment of the mA and/or kV according to patient size and/or use of iterative reconstruction technique. COMPARISON:  No pertinent prior exams available for comparison. FINDINGS: CT HEAD FINDINGS Brain: No age advanced or lobar  predominant parenchymal atrophy. Patchy and ill-defined hypoattenuation within the cerebral white matter, greatest within the right frontoparietal lobes. Partially empty sella turcica. There is no acute intracran

## 2021-09-03 NOTE — Progress Notes (Signed)
Order to discharge pt home.  Discharge instructions/AVS given to patient and reviewed - education provided as needed.  Pt advised to call PCP and/or come back to the hospital if there are any problems. Pt verbalized understanding.    

## 2021-09-03 NOTE — Plan of Care (Signed)

## 2021-09-03 NOTE — TOC Transition Note (Signed)
Transition of Care (TOC) - CM/SW Discharge Note ? ? ?Patient Details  ?Name: Dominique Reynolds ?MRN: [id number] ?Date of Birth: [birth year] ? ?Transition of Care (TOC) CM/SW Contact:  ?Kermit Balo, RN ?Phone Number: ?09/03/2021, 1:00 PM ? ? ?Clinical Narrative:    ?Patient is discharging home with self care. No needs per TOC. ? ? ?Final next level of care: Home/Self Care ?Barriers to Discharge: No Barriers Identified ? ? ?Patient Goals and CMS Choice ?  ?  ?  ? ?Discharge Placement ?  ?           ?  ?  ?  ?  ? ?Discharge Plan and Services ?  ?  ?           ?  ?  ?  ?  ?  ?  ?  ?  ?  ?  ? ?Social Determinants of Health (SDOH) Interventions ?  ? ? ?Readmission Risk Interventions ?   ? View : No data to display.  ?  ?  ?  ? ? ? ? ? ?

## 2021-09-03 NOTE — Hospital Course (Addendum)
Dominique Reynolds is a 61 y.o. female with history of seizures presented to hospital with 2 episodes of seizure-like activity at [facility]..  Patient did not remember anything leading up to the event.  Initially she had no appropriate verbal response.  But then she had repeated seizures in the ED and the pain as well and remained persistently confused was transferred to [facility] for continuous EEG and neurology follow-up.   EEG done at [facility] was negative. ?  ?Assessment and Plan: ? ?Recurrent seizures ?Patient had seizure episodes prior to arrival and also witnessed in the ED.  Neurology on board.  Patient was loaded with IV Keppra.  Neurology recommends Keppra 500 mg twice a day on discharge.Marland Kitchen  MRI of the brain was negative for acute findings.  Communicated with neurology.  Patient is stable for disposition home today will need to follow-up with neurology as outpatient in 4 to 6 weeks.  Ambulatory referral to neurology has been made. ? ?Hyperglycemia--- likely reactive.  Latest hemoglobin A1c of 5.1.   ?

## 2021-09-03 NOTE — Progress Notes (Signed)
NEUROLOGY CONSULTATION PROGRESS NOTE  ? ?Date of service: September 03, 2021 ?Patient Name: Dominique Reynolds ?MRN:  [id number] ?DOB:  1960-11-01 ? ?Brief HPI  ?Dominique Reynolds is a 61 y.o. female with a history of a single previous seizure who presents with least two episodes of seizure-like activity 09/02/21.  She lives with her daughter, and was apparently normal that morning. Patient reports that she does not remember anything leading up to the event.  Initially, she had no verbal responses, but was moving all four extremities.  She subsequently had a repeat seizure in the emergency department that required 2 mg of IV Ativan.  She remained persistently confused after the second seizure and therefore plan was to transfer [facility] for [facility].  In the interim, she had an EEG done at [facility], which was negative.  ?  ?Interval Hx  ?09/02/21- She reports no memory of today's events.  She denies any antecedent symptoms including fevers, headache, or other illness. ?09/03/21- On the way to MRI. No overnight complaints. No szs on EEG yesterday. Patient and family hopeful for discharge today.  ?Vitals  ? ?Vitals:  ? 09/02/21 2012 09/02/21 2346 09/03/21 0345 09/03/21 0810  ?BP: 117/77 111/84 108/76 110/75  ?Pulse: 86 89 93 92  ?Resp: 16 18 20 18   ?Temp: 98.4 ?F (36.9 ?C) 98.9 ?F (37.2 ?C) 99.2 ?F (37.3 ?C) 98.3 ?F (36.8 ?C)  ?TempSrc: Oral Oral Oral   ?SpO2: 98% 99% 99% 99%  ?Weight:      ?  ? ?There is no height or weight on file to calculate BMI. ? ?Physical Exam  ? ?General: Laying comfortably in bed; in no acute distress.  ?HENT: Normal oropharynx and mucosa. Normal external appearance of ears and nose.  ?Neck: Supple, no pain or tenderness  ?CV: No JVD. No peripheral edema.  ?Pulmonary: Symmetric Chest rise. Normal respiratory effort.  ?Abdomen: Soft to touch, non-tender.  ?Ext: No cyanosis, edema, or deformity  ?Skin: No rash. Normal palpation of skin.   ?Musculoskeletal: Normal digits and nails by inspection.  No clubbing.  ? ?Neurologic Examination  ?Mental status/Cognition: Alert, oriented to self, place, month and year, good attention.  ?Speech/language: Fluent, comprehension intact, object naming intact, repetition intact.  ?Cranial nerves:  ? CN II Pupils equal and reactive to light, no VF deficits   ? CN III,IV,VI EOM intact, no gaze preference or deviation, no nystagmus   ? CN V normal sensation in V1, V2, and V3 segments bilaterally   ? CN VII no asymmetry, no nasolabial fold flattening   ? CN VIII normal hearing to speech   ? CN IX & X normal palatal elevation, no uvular deviation   ? CN XI 5/5 head turn and 5/5 shoulder shrug bilaterally   ? CN XII midline tongue protrusion   ? ?Motor: ?Tone is normal. Bulk is normal. 5/5 strength was present in all four extremities, though she gives poor effort in the lower extremities ?Sensory: ?Sensation is symmetric to light touch and temperature in the arms and legs. ?Cerebellar: ?no ataxia noted ? ? ?Labs  ? ?Basic Metabolic Panel:  ?Lab Results  ?Component Value Date  ? NA 139 09/03/2021  ? K 3.7 09/03/2021  ? CO2 23 09/03/2021  ? GLUCOSE 95 09/03/2021  ? BUN 10 09/03/2021  ? CREATININE 0.50 09/03/2021  ? CALCIUM 8.7 (L) 09/03/2021  ? GFRNONAA >60 09/03/2021  ? ?HbA1c:  ?Lab Results  ?Component Value Date  ? HGBA1C 5.1 09/02/2021  ? ?LDL:  No results found for: LDLCALC ?Urine Drug Screen: No results found for: LABOPIA, COCAINSCRNUR, LABBENZ, AMPHETMU, THCU, LABBARB  ?Alcohol Level No results found for: ETH ?No results found for: PHENYTOIN, ZONISAMIDE, LAMOTRIGINE, LEVETIRACETA ?No results found for: PHENYTOIN, PHENOBARB, VALPROATE, CBMZ ? ?Imaging and Diagnostic studies  ?Results for orders placed during the hospital encounter of 09/02/21 ? ?CT Head Wo Contrast ? ?Narrative ?CLINICAL DATA:  Provided history: Seizure, new onset, history of ?trauma. Poly trauma, blunt. ? ?EXAM: ?CT HEAD WITHOUT CONTRAST ? ?CT CERVICAL SPINE WITHOUT CONTRAST ? ?TECHNIQUE: ?Multidetector CT  imaging of the head and cervical spine was ?performed following the standard protocol without intravenous ?contrast. Multiplanar CT image reconstructions of the cervical spine ?were also generated. ? ?RADIATION DOSE REDUCTION: This exam was performed according to the ?departmental dose-optimization program which includes automated ?exposure control, adjustment of the mA and/or kV according to ?patient size and/or use of iterative reconstruction technique. ? ?COMPARISON:  No pertinent prior exams available for comparison. ? ?FINDINGS: ?CT HEAD FINDINGS ? ?Brain: ? ?No age advanced or lobar predominant parenchymal atrophy. ? ?Patchy and ill-defined hypoattenuation within the cerebral white ?matter, greatest within the right frontoparietal lobes. ? ?Partially empty sella turcica. ? ?There is no acute intracranial hemorrhage. ? ?No demarcated cortical infarct. ? ?No extra-axial fluid collection. ? ?No evidence of an intracranial mass. ? ?No midline shift. ? ?Vascular: No hyperdense vessel. ? ?Skull: Normal. Negative for fracture or focal lesion. ? ?Sinuses/Orbits: Visualized orbits show no acute finding. 9 mm mucous ?retention cyst within the left maxillary sinus. ? ?CT CERVICAL SPINE FINDINGS ? ?Alignment: Straightening of the expected cervical lordosis. No ?significant spondylolisthesis. ? ?Skull base and vertebrae: The basion-dental and atlanto-dental ?intervals are maintained.No evidence of acute fracture to the ?cervical spine. Congenital nonunion of the posterior arch of C1. ? ?Soft tissues and spinal canal: No prevertebral fluid or swelling. No ?visible canal hematoma. ? ?Disc levels: Cervical spondylosis, greatest at C5-C6. At this level, ?there is advanced disc space narrowing with a disc bulge, endplate ?spurring and bilateral uncovertebral hypertrophy. No appreciable ?high-grade spinal canal stenosis. Mild left C5-C6 bony neural ?foraminal narrowing. ? ?Upper chest: No consolidation within the imaged lung  apices. No ?visible pneumothorax. ? ?IMPRESSION: ?CT head: ? ?1. No evidence of acute intracranial hemorrhage, acute cortical ?infarct or intracranial mass. ?2. Mild nonspecific cerebral white matter disease. ?3. 9 mm mucous retention cyst within the left maxillary sinus. ? ?CT cervical spine: ? ?1. No evidence of acute fracture to the cervical spine. ?2. Nonspecific straightening of the expected cervical lordosis. ?3. Cervical spondylosis, greatest at C5-C6. ? ? ?Electronically Signed ?By: Jackey Loge D.O. ?On: 09/02/2021 11:01 ? ? ?Impression  ? ?Aniya Jolicoeur is a 61 y.o. female with PMH significant for single previous seizure who presents with second and third lifetime seizures happening in close proximity.  With her continued confusion she was admitted for observation, but she does not seem to be having any signs of ongoing seizure activity at this time.  She was started on antiepileptic medication and awaiting MRI as well. There are no focal neuro deficits on neuro exam today. ? ?Recommendations  ?- MRI brain WO contrast- pending and will follow and if negative for abnormality Neuro will sign off ?- Continue Keppra 500mg  PO BID ?- Will need outpatient Neurology follow up ?- Seizure precautions: No driving. Report seizure disorder to the DMV No climbing ladders or heights No bathing alone Avoid sleep deprivation ? ?Seizure Precautions: ?- please refrain from climbing higher  than 10 feet ?- please refrain from taking a tub bath alone and/or leave the door unlocked while in the shower ?- please avoid sleep deprivation and excessive alcohol use ?- please refrain from swimming unsupervised ?- please wear a helmet when riding a bike or rollerblades ?- please refrain from driving for 6 months since last seizure ?- older persons are advised to take reasonable precautions  ?- you have restrictions with more dangerous activities, such as operating heavy machinery or machines with rapidly moving parts and playing  contact sports ?  ? ?________________________________________________________________ ? ?NEUROHOSPITALIST ADDENDUM ?Performed a face to face diagnostic evaluation.  ? ?I have reviewed the contents of history an

## 2021-09-11 ENCOUNTER — Ambulatory Visit
Admission: EM | Admit: 2021-09-11 | Discharge: 2021-09-11 | Disposition: A | Discharge status: Home / Self Care | Payer: Commercial Managed Care - PPO | Attending: Family Medicine | Admitting: Family Medicine

## 2021-09-11 DIAGNOSIS — R1030 Lower abdominal pain, unspecified: Secondary | ICD-10-CM | POA: Diagnosis not present

## 2021-09-11 LAB — POCT URINALYSIS DIP (MANUAL ENTRY)
Bilirubin, UA: NEGATIVE
Blood, UA: NEGATIVE
Glucose, UA: NEGATIVE mg/dL
Ketones, POC UA: NEGATIVE mg/dL
Leukocytes, UA: NEGATIVE
Nitrite, UA: NEGATIVE
Protein Ur, POC: NEGATIVE mg/dL
Spec Grav, UA: <=1.005 — AB (ref 1.010–1.025)
Urobilinogen, UA: 0.2 E.U./dL
pH, UA: 5.5 (ref 5.0–8.0)

## 2021-09-11 NOTE — Discharge Instructions (Signed)
You have had labs (blood work) drawn today. We will call you with any significant abnormalities or if there is need to begin or change treatment or pursue further follow up. ? ?You may also review your test results online through [facility]. If you do not have a MyChart account, instructions to sign up should be on your discharge paperwork. ? ?You have been seen today for abdominal pain. Your evaluation was not suggestive of any emergent condition requiring medical intervention at this time. However, some abdominal problems make take more time to appear. Therefore, it is very important for you to pay attention to any new symptoms or worsening of your current condition. ? ?Please return here or to the Emergency Department immediately should you begin to feel worse in any way or have any of the following symptoms: increasing or different abdominal pain, persistent vomiting, inability to drink fluids, fevers, or shaking chills.  ? ?

## 2021-09-11 NOTE — ED Provider Notes (Signed)
?[facility] ? ? ?[id number] ?09/11/21 Arrival Time: [phone] ? ?ASSESSMENT & PLAN: ? ?1. Lower abdominal pain   ? ?Unclear etiology. Afebrile. Without n/v. Tolerating PO intake. ?Benign abdominal exam. No indications for urgent abdominal/pelvic imaging at this time. Discussed. ? ?Labs Reviewed  ?POCT URINALYSIS DIP (MANUAL ENTRY) - Abnormal; Notable for the following components:  ?    Result Value  ? Spec Grav, UA <=1.005 (*)   ? All other components within normal limits  ?  ?Pending:  ?CBC WITH DIFFERENTIAL/PLATELET  ?COMPREHENSIVE METABOLIC PANEL  ?LIPASE  ?  ? ?Will await lab work. Ultimately may need imaging should symptoms persist/worsen. Voices understanding. Trying to get established with PCP.  ? ? ? ?Discharge Instructions   ? ?  ?You have had labs (blood work) drawn today. We will call you with any significant abnormalities or if there is need to begin or change treatment or pursue further follow up. ? ?You may also review your test results online through MyChart. If you do not have a MyChart account, instructions to sign up should be on your discharge paperwork. ? ?You have been seen today for abdominal pain. Your evaluation was not suggestive of any emergent condition requiring medical intervention at this time. However, some abdominal problems make take more time to appear. Therefore, it is very important for you to pay attention to any new symptoms or worsening of your current condition. ? ?Please return here or to the Emergency Department immediately should you begin to feel worse in any way or have any of the following symptoms: increasing or different abdominal pain, persistent vomiting, inability to drink fluids, fevers, or shaking chills.  ? ? ? ? Follow-up Information   ? ? [facility] EMERGENCY DEPARTMENT.   ?Specialty: Emergency Medicine ?Why: If symptoms worsen in any way. ?Contact information: ?[address] ?[id number] ?[place] [address] ?[phone] ? ?  ?  ? ?  ?   ? ?  ? ? ?Reviewed expectations re: course of current medical issues. Questions answered. ?Outlined signs and symptoms indicating need for more acute intervention. ?Patient verbalized understanding. ?After Visit Summary given. ? ? ?SUBJECTIVE: ?History from: patient. ?Dominique Reynolds is a 61 y.o. female who presents with complaint of intermittent lower abdominal discomfort. Onset gradual,  over past sev days; initially LLQ but now feels more in RLQ . Discomfort described as aching; without radiation; does not wake her at night and does not limit activities when present. Reports normal flatus. Symptoms are unchanged since beginning. Fever: absent. Aggravating factors: questions mild post-prandial pain. Alleviating factors: have not been identified. Associated symptoms: none reported. Appetite: normal. PO intake: normal. Ambulatory without assistance. Urinary symptoms: none. Bowel movements: have not significantly changed; non-bloody. ?History of similar: no. ?OTC treatment: none. ? ?No LMP recorded. Patient is postmenopausal. ? ?Past Surgical History:  ?Procedure Laterality Date  ? REPLACEMENT TOTAL KNEE BILATERAL    ? ?OBJECTIVE: ? ?Vitals:  ? 09/11/21 0926  ?BP: 121/75  ?Pulse: (!) 55  ?Resp: 16  ?Temp: 97.7 ?F (36.5 ?C)  ?TempSrc: Oral  ?SpO2: 98%  ?  ?General appearance: alert, oriented, no acute distress ?HEENT: [place]; AT; oropharynx moist ?Lungs: unlabored respirations ?Abdomen: soft; without distention; mild  and poorly localized tenderness to palpation over lower abdomen ; without masses or organomegaly; without guarding or rebound tenderness ?Back: without reported CVA tenderness; FROM at waist ?Extremities: without LE edema; symmetrical; without gross deformities ?Skin: warm and dry ?Neurologic: normal gait ?Psychological: alert and cooperative; normal  mood and affect ? ?Labs: ?Results for orders placed or performed during the hospital encounter of 09/11/21  ?POCT urinalysis dipstick  ?Result Value Ref Range   ? Color, UA yellow yellow  ? Clarity, UA clear clear  ? Glucose, UA negative negative mg/dL  ? Bilirubin, UA negative negative  ? Ketones, POC UA negative negative mg/dL  ? Spec Grav, UA <=1.005 (A) 1.010 - 1.025  ? Blood, UA negative negative  ? pH, UA 5.5 5.0 - 8.0  ? Protein Ur, POC negative negative mg/dL  ? Urobilinogen, UA 0.2 0.2 or 1.0 E.U./dL  ? Nitrite, UA Negative Negative  ? Leukocytes, UA Negative Negative  ? ?Labs Reviewed  ?POCT URINALYSIS DIP (MANUAL ENTRY) - Abnormal; Notable for the following components:  ?    Result Value  ? Spec Grav, UA <=1.005 (*)   ? All other components within normal limits  ?CBC WITH DIFFERENTIAL/PLATELET  ?COMPREHENSIVE METABOLIC PANEL  ?LIPASE  ? ? ?No Known Allergies ?                                            ?Past Medical History:  ?Diagnosis Date  ? Seizures (HCC)   ? ? ?Social History  ? ?Socioeconomic History  ? Marital status: Divorced  ?  Spouse name: Not on file  ? Number of children: Not on file  ? Years of education: Not on file  ? Highest education level: Not on file  ?Occupational History  ? Not on file  ?Tobacco Use  ? Smoking status: Never  ? Smokeless tobacco: Never  ?Substance and Sexual Activity  ? Alcohol use: Not Currently  ? Drug use: Never  ? Sexual activity: Not Currently  ?Other Topics Concern  ? Not on file  ?Social History Narrative  ? Not on file  ? ?Social Determinants of Health  ? ?Financial Resource Strain: Not on file  ?Food Insecurity: Not on file  ?Transportation Needs: Not on file  ?Physical Activity: Not on file  ?Stress: Not on file  ?Social Connections: Not on file  ?Intimate Partner Violence: Not on file  ? ? ?History reviewed. No pertinent family history. ?  Mardella Layman, MD ?09/11/21 1511 ? ?

## 2021-09-11 NOTE — ED Triage Notes (Signed)
Pt reports on and off lower abdominal pain x 1 week. Pt reports abdominal pain started around the same time she started taking Keppra.  ?

## 2021-09-12 LAB — COMPREHENSIVE METABOLIC PANEL
ALT: 11 IU/L (ref 0–32)
AST: 18 IU/L (ref 0–40)
Albumin/Globulin Ratio: 1.9 (ref 1.2–2.2)
Albumin: 4.7 g/dL (ref 3.8–4.8)
Alkaline Phosphatase: 80 IU/L (ref 44–121)
BUN/Creatinine Ratio: 24 (ref 12–28)
BUN: 15 mg/dL (ref 8–27)
Bilirubin Total: 0.3 mg/dL (ref 0.0–1.2)
CO2: 24 mmol/L (ref 20–29)
Calcium: 9.6 mg/dL (ref 8.7–10.3)
Chloride: 103 mmol/L (ref 96–106)
Creatinine, Ser: 0.62 mg/dL (ref 0.57–1.00)
Globulin, Total: 2.5 g/dL (ref 1.5–4.5)
Glucose: 97 mg/dL (ref 70–99)
Potassium: 4.4 mmol/L (ref 3.5–5.2)
Sodium: 141 mmol/L (ref 134–144)
Total Protein: 7.2 g/dL (ref 6.0–8.5)
eGFR: 101 mL/min/{1.73_m2} (ref >59)

## 2021-09-12 LAB — CBC WITH DIFFERENTIAL/PLATELET
Basophils Absolute: 0 10*3/uL (ref 0.0–0.2)
Basos: 1 %
EOS (ABSOLUTE): 0 10*3/uL (ref 0.0–0.4)
Eos: 1 %
Hematocrit: 40.1 % (ref 34.0–46.6)
Hemoglobin: 13.5 g/dL (ref 11.1–15.9)
Immature Grans (Abs): 0 10*3/uL (ref 0.0–0.1)
Immature Granulocytes: 0 %
Lymphocytes Absolute: 1.4 10*3/uL (ref 0.7–3.1)
Lymphs: 27 %
MCH: 29 pg (ref 26.6–33.0)
MCHC: 33.7 g/dL (ref 31.5–35.7)
MCV: 86 fL (ref 79–97)
Monocytes Absolute: 0.5 10*3/uL (ref 0.1–0.9)
Monocytes: 10 %
Neutrophils Absolute: 3.1 10*3/uL (ref 1.4–7.0)
Neutrophils: 61 %
Platelets: 285 10*3/uL (ref 150–450)
RBC: 4.66 x10E6/uL (ref 3.77–5.28)
RDW: 12.8 % (ref 11.7–15.4)
WBC: 5.1 10*3/uL (ref 3.4–10.8)

## 2021-09-12 LAB — LIPASE: Lipase: 42 U/L (ref 14–72)

## 2021-09-25 ENCOUNTER — Encounter: Payer: Self-pay | Admitting: Neurology

## 2021-09-25 ENCOUNTER — Ambulatory Visit (INDEPENDENT_AMBULATORY_CARE_PROVIDER_SITE_OTHER): Payer: Commercial Managed Care - PPO | Admitting: Neurology

## 2021-09-25 VITALS — BP 110/67 | HR 66 | Ht <= 58 in | Wt 132.2 lb

## 2021-09-25 DIAGNOSIS — G40909 Epilepsy, unspecified, not intractable, without status epilepticus: Secondary | ICD-10-CM

## 2021-09-25 MED ORDER — LEVETIRACETAM 500 MG PO TABS: 500.0000 mg | ORAL_TABLET | Freq: Two times a day (BID) | ORAL | 4 refills | Status: DC

## 2021-09-25 NOTE — Progress Notes (Signed)
? ?[facility] ? ?PATIENT: Dominique Reynolds ?DOB: [birth year] ? ?REQUESTING CLINICIAN: Muse, Rochelle D., PA-C ?HISTORY FROM: Patient, daughter and chart review  ?REASON FOR VISIT: New onset epilepsy  ? ? ?HISTORICAL ? ?CHIEF COMPLAINT:  ?Chief Complaint  ?Patient presents with  ? New Patient (Initial Visit)  ?  Rm 15. Accompanied by daughter, Marchelle Folksmanda. ?NP internal referral for seizure.  ? ? ?HISTORY OF PRESENT ILLNESS:  ?This is a 61 year old woman with no reported past medical history who is presenting after a seizure.  Patient reported on the morning of April 10, she remembered getting out of her car, then felt dizzy and the next and that she remembers is waking up from the hospital, not sure if it was the same day or next morning.  ?Per daughter patient fell and had seizure-like activity, she was taken to the hospital, initially she was unresponsive, and was witnessed to have a second seizure in the ED.  At that time she was loaded with Keppra and Ativan.  Initial EEG did show diffuse slowing, no epileptiform discharges and her MRI brain did not show any acute abnormality.  ?Patient does report that 3 [place] ago [place] she had a seizure, she fell and had seizure-like activity.  She was admitted in the hospital, daughter reported a lot of work were done including MRI and EEG but she was not started on any antiseizure medication.  Since starting the medication, she denies any side effect, report compliance and no additional seizure-like activity.  She denies any seizure risk factors.  ? ? ?Handedness: Right handed ? ?Onset: 2019 ? ?Seizure Type: Unclear, probably focal ? ?Current frequency: Only 2 ? ?Any injuries from seizures: None  ? ?Seizure risk factors: None reported  ? ?Previous ASMs: None ? ?Currenty ASMs: Levetiracetam 500 mg twice daily ? ?ASMs side effects: Denies ? ?Brain Images: No acute intracranial abnormality ? ?Previous EEGs: Diffuse slowing ? ? ?OTHER MEDICAL CONDITIONS:  None reported  ? ?REVIEW OF SYSTEMS: Full 14 system review of systems performed and negative with exception of: as noted in the HPI  ? ?ALLERGIES: ?No Known Allergies ? ?HOME MEDICATIONS: ?Outpatient Medications Prior to Visit  ?Medication Sig Dispense Refill  ? levETIRAcetam (KEPPRA) 500 MG tablet Take 1 tablet (500 mg total) by mouth 2 (two) times daily. 60 tablet 2  ? ?No facility-administered medications prior to visit.  ? ? ?PAST MEDICAL HISTORY: ?Past Medical History:  ?Diagnosis Date  ? Seizures (HCC)   ? ? ?PAST SURGICAL HISTORY: ?Past Surgical History:  ?Procedure Laterality Date  ? REPLACEMENT TOTAL KNEE BILATERAL    ? ? ?FAMILY HISTORY: ?History reviewed. No pertinent family history. ? ?SOCIAL HISTORY: ?Social History  ? ?Socioeconomic History  ? Marital status: Divorced  ?  Spouse name: Not on file  ? Number of children: Not on file  ? Years of education: Not on file  ? Highest education level: Not on file  ?Occupational History  ? Not on file  ?Tobacco Use  ? Smoking status: Never  ? Smokeless tobacco: Never  ?Substance and Sexual Activity  ? Alcohol use: Not Currently  ? Drug use: Never  ? Sexual activity: Not Currently  ?Other Topics Concern  ? Not on file  ?Social History Narrative  ? Not on file  ? ?Social Determinants of Health  ? ?Financial Resource Strain: Not on file  ?Food Insecurity: Not on file  ?Transportation Needs: Not on file  ?Physical Activity: Not on file  ?Stress: Not  on file  ?Social Connections: Not on file  ?Intimate Partner Violence: Not on file  ? ? ? ?PHYSICAL EXAM ? ?GENERAL EXAM/CONSTITUTIONAL: ?Vitals:  ?Vitals:  ? 09/25/21 0843  ?BP: 110/67  ?Pulse: 66  ?Weight: 132 lb 3.2 oz (60 kg)  ?Height: 4\' 7"  (1.397 m)  ? ?Body mass index is 30.73 kg/m?. ?Wt Readings from Last 3 Encounters:  ?09/25/21 132 lb 3.2 oz (60 kg)  ?09/02/21 139 lb 5.3 oz (63.2 kg)  ? ?Patient is in no distress; well developed, nourished and groomed; neck is supple ? ?CARDIOVASCULAR: ?Examination of carotid  arteries is normal; no carotid bruits ?Regular rate and rhythm, no murmurs ?Examination of peripheral vascular system by observation and palpation is normal ? ?EYES: ?Pupils round and reactive to light, Visual fields full to confrontation, Extraocular movements intacts,  ?No results found. ? ?MUSCULOSKELETAL: ?Gait, strength, tone, movements noted in Neurologic exam below ? ?NEUROLOGIC: ?MENTAL STATUS:  ?   ? View : No data to display.  ?  ?  ?  ? ?awake, alert, oriented to person, place and time ?recent and remote memory intact ?normal attention and concentration ?language fluent, comprehension intact, naming intact ?fund of knowledge appropriate ? ?CRANIAL NERVE:  ?2nd, 3rd, 4th, 6th - pupils equal and reactive to light, visual fields full to confrontation, extraocular muscles intact, no nystagmus ?5th - facial sensation symmetric ?7th - facial strength symmetric ?8th - hearing intact ?9th - palate elevates symmetrically, uvula midline ?11th - shoulder shrug symmetric ?12th - tongue protrusion midline ? ?MOTOR:  ?normal bulk and tone, full strength in the BUE, BLE ? ?SENSORY:  ?normal and symmetric to light touch, pinprick, temperature, vibration ? ?COORDINATION:  ?finger-nose-finger, fine finger movements normal ? ?REFLEXES:  ?deep tendon reflexes present and symmetric ? ?GAIT/STATION:  ?normal ? ? ?DIAGNOSTIC DATA (LABS, IMAGING, TESTING) ?- I reviewed patient records, labs, notes, testing and imaging myself where available. ? ?Lab Results  ?Component Value Date  ? WBC 5.1 09/11/2021  ? HGB 13.5 09/11/2021  ? HCT 40.1 09/11/2021  ? MCV 86 09/11/2021  ? PLT 285 09/11/2021  ? ?   ?Component Value Date/Time  ? NA 141 09/11/2021 1017  ? K 4.4 09/11/2021 1017  ? CL 103 09/11/2021 1017  ? CO2 24 09/11/2021 1017  ? GLUCOSE 97 09/11/2021 1017  ? GLUCOSE 95 09/03/2021 0327  ? BUN 15 09/11/2021 1017  ? CREATININE 0.62 09/11/2021 1017  ? CALCIUM 9.6 09/11/2021 1017  ? PROT 7.2 09/11/2021 1017  ? ALBUMIN 4.7 09/11/2021 1017   ? AST 18 09/11/2021 1017  ? ALT 11 09/11/2021 1017  ? ALKPHOS 80 09/11/2021 1017  ? BILITOT 0.3 09/11/2021 1017  ? GFRNONAA >60 09/03/2021 0327  ? ?No results found for: CHOL, HDL, LDLCALC, LDLDIRECT, TRIG ?Lab Results  ?Component Value Date  ? HGBA1C 5.1 09/02/2021  ? ?No results found for: VITAMINB12 ?No results found for: TSH ? ?MRI Brain 09/03/21 ?1. No acute intracranial abnormality or etiology of seizures identified. ?2. Mild-to-moderate chronic small vessel ischemic disease ? ? ?EEG 09/02/21 ?- Continuous slow, generalized ?  ?IMPRESSION: ?This study is suggestive of moderate diffuse encephalopathy, nonspecific etiology. No seizures or definite epileptiform discharges were seen throughout the recording. ? ?I personally reviewed brain Images and previous EEG reports.  ? ?ASSESSMENT AND PLAN ? ?61 y.o. year old female  with no reported past medical history who is presenting after seizure on April 10.  Initial work-up including EEG and MRI brain is unrevealing.  Patient  report having a seizure back in 2019, at that time also had extensive work-up and was not started on antiseizure medication.  Since starting Keppra, she reports compliance with the medication and denies any side effect.  ?I have informed patient that since she had 2 seizures one in 2019 and another one in 2023, she meets criteria for epilepsy.  Currently it is unclear if she has focal versus generalized epilepsy but I would like to obtain an ambulatory EEG because the previous EEG was done after she was given Ativan and it only showed diffuse slowing.  ?I will continue her on Keppra 500 mg twice daily, I will contact her after completion of the MRI and I will see her in 3 months for follow-up.  Also discussed driving restriction for the next 6 months in order seizure precaution. ? ? ?1. Seizure disorder (HCC)   ? ? ?Patient Instructions  ?Continue with Keppra 500 mg twice daily ?We will check a Keppra level today ?We will order a 24-hour  ambulatory EEG, previous EEG showed diffuse slowing and it was done after patient was in given Ativan ?Follow-up in 3 months ? ? ?Per [facility] statutes, patients with seizures are not allowed to drive

## 2021-09-25 NOTE — Patient Instructions (Signed)
Continue with Keppra 500 mg twice daily ?We will check a Keppra level today ?We will order a 24-hour ambulatory EEG, previous EEG showed diffuse slowing and it was done after patient was in given Ativan ?Follow-up in 3 months ?

## 2021-09-26 LAB — LEVETIRACETAM LEVEL: Levetiracetam Lvl: 31.3 ug/mL (ref 10.0–40.0)

## 2021-10-23 ENCOUNTER — Encounter: Payer: Self-pay | Admitting: Orthopedic Surgery

## 2021-10-23 ENCOUNTER — Ambulatory Visit (INDEPENDENT_AMBULATORY_CARE_PROVIDER_SITE_OTHER): Payer: Commercial Managed Care - PPO | Admitting: Orthopedic Surgery

## 2021-10-23 VITALS — BP 126/78 | HR 80 | Ht <= 58 in | Wt 130.0 lb

## 2021-10-23 DIAGNOSIS — M7050 Other bursitis of knee, unspecified knee: Secondary | ICD-10-CM | POA: Diagnosis not present

## 2021-10-23 DIAGNOSIS — M25561 Pain in right knee: Secondary | ICD-10-CM

## 2021-10-23 DIAGNOSIS — S8001XA Contusion of right knee, initial encounter: Secondary | ICD-10-CM

## 2021-10-23 DIAGNOSIS — Z96651 Presence of right artificial knee joint: Secondary | ICD-10-CM

## 2021-10-23 NOTE — Progress Notes (Signed)
Chief Complaint  Patient presents with   Knee Pain    Right knee s/p fall having a seizure 09/02/21    HPI: 61 year old female his first presentation has had bilateral total knees 1 done in [place] 1 done in [place].  These were done about 20 years ago.  She was in usual state of health until she had a seizure and landed on her right knee.  This did not affect the left knee.  She says when she is of normal weight for her and eating the right foods she does not have any knee pain.  However when she is overweight based on her own BMI calculations then she starts having some discomfort in certain foods also make her knee hurt.  She complains of tightness in the knee and discomfort anteromedially.  This is worsened since she fell  Past Medical History:  Diagnosis Date   Seizures (HCC)     BP 126/78   Pulse 80   Ht 4\' 7"  (1.397 m)   Wt 130 lb (59 kg)   BMI 30.21 kg/m    General appearance: Well-developed well-nourished no gross deformities  Cardiovascular normal pulse and perfusion normal color without edema  Neurologically no sensation loss or deficits or pathologic reflexes  Psychological: Awake alert and oriented x3 mood and affect normal  Skin no lacerations or ulcerations no nodularity no palpable masses, no erythema or nodularity  Musculoskeletal: Right knee incision is very small and short.  No tenderness there no sign of infection no effusion no instability in flexion or extension she is tender over the pes bursa and anteromedial tibia  Imaging outside imaging shows a well fixed implant the patella was not done alignment looks normal no loosening  A/P  Acute knee pain status post total knee 20 years ago  Probable tendinitis versus contusion  Recommend ice and ibuprofen or Advil or Aleve for swelling and pain.  Follow-up as needed

## 2021-10-24 ENCOUNTER — Ambulatory Visit: Payer: Commercial Managed Care - PPO | Admitting: Orthopedic Surgery

## 2021-12-10 ENCOUNTER — Telehealth: Payer: Self-pay | Admitting: Orthopaedic Surgery

## 2021-12-10 NOTE — Telephone Encounter (Signed)
Called patient per voice message received - 'had another fall' - I reached voicemail, left message.

## 2021-12-11 ENCOUNTER — Other Ambulatory Visit (HOSPITAL_COMMUNITY): Payer: Self-pay | Admitting: Nurse Practitioner

## 2021-12-11 ENCOUNTER — Ambulatory Visit (HOSPITAL_COMMUNITY)
Admission: RE | Admit: 2021-12-11 | Discharge: 2021-12-11 | Disposition: A | Discharge status: Home / Self Care | Payer: Commercial Managed Care - PPO | Source: Ambulatory Visit | Attending: Nurse Practitioner | Admitting: Nurse Practitioner

## 2021-12-11 DIAGNOSIS — M25561 Pain in right knee: Secondary | ICD-10-CM | POA: Insufficient documentation

## 2021-12-11 DIAGNOSIS — M25562 Pain in left knee: Secondary | ICD-10-CM | POA: Diagnosis present

## 2021-12-12 NOTE — Telephone Encounter (Signed)
Called back to patient to follow up due to no response. Reached today, 12/12/21, and patient states she has already seen her primary care doctor. Aware if needs to schedule, she will call back.

## 2022-01-02 ENCOUNTER — Encounter: Payer: Self-pay | Admitting: Neurology

## 2022-01-02 ENCOUNTER — Ambulatory Visit (INDEPENDENT_AMBULATORY_CARE_PROVIDER_SITE_OTHER): Payer: Commercial Managed Care - PPO | Admitting: Neurology

## 2022-01-02 ENCOUNTER — Telehealth: Payer: Self-pay | Admitting: Neurology

## 2022-01-02 VITALS — BP 116/78 | HR 73 | Ht <= 58 in | Wt 135.5 lb

## 2022-01-02 DIAGNOSIS — G40909 Epilepsy, unspecified, not intractable, without status epilepticus: Secondary | ICD-10-CM | POA: Diagnosis not present

## 2022-01-02 NOTE — Telephone Encounter (Signed)
Pt is calling and requesting if she can get a work note stating she was in the office today.

## 2022-01-02 NOTE — Patient Instructions (Signed)
Continue with Keppra 500 mg BID  Follow up with ambulatory EEG  Return in a year or sooner if worse

## 2022-01-02 NOTE — Progress Notes (Signed)
[facility]  PATIENT: Dominique Reynolds DOB: 1961-03-06  REQUESTING CLINICIAN: Benita Stabile, MD HISTORY FROM: Patient, daughter and chart review  REASON FOR VISIT: New onset epilepsy    HISTORICAL  CHIEF COMPLAINT:  Chief Complaint  Patient presents with   Follow-up    Room 14 - alone. No seizures reported. Continues to take levetiracetam 500mg , one tab BID.    INTERVAL HISTORY 01/02/22 Patient presents today for follow-up, last visit was in May since then she denies any additional seizures.  She is compliant with Keppra 500 mg twice daily, denies any side effect from the medications.  Ambulatory EEG was ordered but has not been completed yet.  I will contact the company to set up the ambulatory EEG.  She does not have any further complaints.   HISTORY OF PRESENT ILLNESS:  This is a 61 year old woman with no reported past medical history who is presenting after a seizure.  Patient reported on the morning of April 10, she remembered getting out of her car, then felt dizzy and the next and that she remembers is waking up from the hospital, not sure if it was the same day or next morning.  Per daughter patient fell and had seizure-like activity, she was taken to the hospital, initially she was unresponsive, and was witnessed to have a second seizure in the ED.  At that time she was loaded with Keppra and Ativan.  Initial EEG did show diffuse slowing, no epileptiform discharges and her MRI brain did not show any acute abnormality.  Patient does report that 3 years ago while in 09-05-1994 she had a seizure, she fell and had seizure-like activity.  She was admitted in the hospital, daughter reported a lot of work were done including MRI and EEG but she was not started on any antiseizure medication.  Since starting the medication, she denies any side effect, report compliance and no additional seizure-like activity.  She denies any seizure risk factors.    Handedness: Right  handed  Onset: 2019  Seizure Type: Unclear, probably focal  Current frequency: Only 2  Any injuries from seizures: None   Seizure risk factors: None reported   Previous ASMs: None  Currenty ASMs: Levetiracetam 500 mg twice daily  ASMs side effects: Denies  Brain Images: No acute intracranial abnormality  Previous EEGs: Diffuse slowing   OTHER MEDICAL CONDITIONS: None reported   REVIEW OF SYSTEMS: Full 14 system review of systems performed and negative with exception of: as noted in the HPI   ALLERGIES: No Known Allergies  HOME MEDICATIONS: Outpatient Medications Prior to Visit  Medication Sig Dispense Refill   cyclobenzaprine (FLEXERIL) 10 MG tablet Take 10 mg by mouth as needed.     ibuprofen (ADVIL) 800 MG tablet Take 800 mg by mouth as needed.     levETIRAcetam (KEPPRA) 500 MG tablet Take 1 tablet (500 mg total) by mouth 2 (two) times daily. 180 tablet 4   No facility-administered medications prior to visit.    PAST MEDICAL HISTORY: Past Medical History:  Diagnosis Date   Seizures (HCC)     PAST SURGICAL HISTORY: Past Surgical History:  Procedure Laterality Date   REPLACEMENT TOTAL KNEE BILATERAL      FAMILY HISTORY: Family History  Problem Relation Age of Onset   Diabetes Mother    Hypertension Mother     SOCIAL HISTORY: Social History   Socioeconomic History   Marital status: Divorced    Spouse name: Not on file   Number  of children: Not on file   Years of education: Not on file   Highest education level: Not on file  Occupational History   Not on file  Tobacco Use   Smoking status: Never   Smokeless tobacco: Never  Substance and Sexual Activity   Alcohol use: Not Currently   Drug use: Never   Sexual activity: Not Currently  Other Topics Concern   Not on file  Social History Narrative   Not on file   Social Determinants of Health   Financial Resource Strain: Not on file  Food Insecurity: Not on file  Transportation Needs: Not  on file  Physical Activity: Not on file  Stress: Not on file  Social Connections: Not on file  Intimate Partner Violence: Not on file     PHYSICAL EXAM  GENERAL EXAM/CONSTITUTIONAL: Vitals:  Vitals:   01/02/22 0917  BP: 116/78  Pulse: 73  Weight: 135 lb 8 oz (61.5 kg)  Height: 4\' 7"  (1.397 m)   Body mass index is 31.49 kg/m. Wt Readings from Last 3 Encounters:  01/02/22 135 lb 8 oz (61.5 kg)  10/23/21 130 lb (59 kg)  09/25/21 132 lb 3.2 oz (60 kg)   Patient is in no distress; well developed, nourished and groomed; neck is supple  CARDIOVASCULAR: Examination of carotid arteries is normal; no carotid bruits Regular rate and rhythm, no murmurs Examination of peripheral vascular system by observation and palpation is normal  EYES: Pupils round and reactive to light, Visual fields full to confrontation, Extraocular movements intacts,  No results found.  MUSCULOSKELETAL: Gait, strength, tone, movements noted in Neurologic exam below  NEUROLOGIC: MENTAL STATUS:      No data to display         awake, alert, oriented to person, place and time recent and remote memory intact normal attention and concentration language fluent, comprehension intact, naming intact fund of knowledge appropriate  CRANIAL NERVE:  2nd, 3rd, 4th, 6th - pupils equal and reactive to light, visual fields full to confrontation, extraocular muscles intact, no nystagmus 5th - facial sensation symmetric 7th - facial strength symmetric 8th - hearing intact 9th - palate elevates symmetrically, uvula midline 11th - shoulder shrug symmetric 12th - tongue protrusion midline  MOTOR:  normal bulk and tone, full strength in the BUE, BLE  SENSORY:  normal and symmetric to light touch, pinprick, temperature, vibration  COORDINATION:  finger-nose-finger, fine finger movements normal  REFLEXES:  deep tendon reflexes present and symmetric  GAIT/STATION:  normal   DIAGNOSTIC DATA (LABS,  IMAGING, TESTING) - I reviewed patient records, labs, notes, testing and imaging myself where available.  Lab Results  Component Value Date   WBC 5.1 09/11/2021   HGB 13.5 09/11/2021   HCT 40.1 09/11/2021   MCV 86 09/11/2021   PLT 285 09/11/2021      Component Value Date/Time   NA 141 09/11/2021 1017   K 4.4 09/11/2021 1017   CL 103 09/11/2021 1017   CO2 24 09/11/2021 1017   GLUCOSE 97 09/11/2021 1017   GLUCOSE 95 09/03/2021 0327   BUN 15 09/11/2021 1017   CREATININE 0.62 09/11/2021 1017   CALCIUM 9.6 09/11/2021 1017   PROT 7.2 09/11/2021 1017   ALBUMIN 4.7 09/11/2021 1017   AST 18 09/11/2021 1017   ALT 11 09/11/2021 1017   ALKPHOS 80 09/11/2021 1017   BILITOT 0.3 09/11/2021 1017   GFRNONAA >60 09/03/2021 0327   No results found for: "CHOL", "HDL", "LDLCALC", "LDLDIRECT", "TRIG" Lab Results  Component Value Date   HGBA1C 5.1 09/02/2021   No results found for: "VITAMINB12" No results found for: "TSH"  MRI Brain 09/03/21 1. No acute intracranial abnormality or etiology of seizures identified. 2. Mild-to-moderate chronic small vessel ischemic disease   EEG 09/02/21 - Continuous slow, generalized   IMPRESSION: This study is suggestive of moderate diffuse encephalopathy, nonspecific etiology. No seizures or definite epileptiform discharges were seen throughout the recording.  I personally reviewed brain Images and previous EEG reports.   ASSESSMENT AND PLAN  61 y.o. year old female  with no reported past medical history who is presenting for seizure follow up. Doing well on Keppra 500 mg BID, no additional seizures, and no side effect.    1. Seizure disorder [facility])     Patient Instructions  Continue with Keppra 500 mg BID  Follow up with ambulatory EEG  Return in a year or sooner if worse    Per [place] statutes, patients with seizures are not allowed to drive until they have been seizure-free for six months.  Other recommendations include using  caution when using heavy equipment or power tools. Avoid working on ladders or at heights. Take showers instead of baths.  Do not swim alone.  Ensure the water temperature is not too high on the home water heater. Do not go swimming alone. Do not lock yourself in a room alone (i.e. bathroom). When caring for infants or small children, sit down when holding, feeding, or changing them to minimize risk of injury to the child in the event you have a seizure. Maintain good sleep hygiene. Avoid alcohol.  Also recommend adequate sleep, hydration, good diet and minimize stress.   During the Seizure  - First, ensure adequate ventilation and place patients on the floor on their left side  Loosen clothing around the neck and ensure the airway is patent. If the patient is clenching the teeth, do not force the mouth open with any object as this can cause severe damage - Remove all items from the surrounding that can be hazardous. The patient may be oblivious to what's happening and may not even know what he or she is doing. If the patient is confused and wandering, either gently guide him/her away and block access to outside areas - Reassure the individual and be comforting - Call 911. In most cases, the seizure ends before EMS arrives. However, there are cases when seizures may last over 3 to 5 minutes. Or the individual may have developed breathing difficulties or severe injuries. If a pregnant patient or a person with diabetes develops a seizure, it is prudent to call an ambulance. - Finally, if the patient does not regain full consciousness, then call EMS. Most patients will remain confused for about 45 to 90 minutes after a seizure, so you must use judgment in calling for help. - Avoid restraints but make sure the patient is in a bed with padded side rails - Place the individual in a lateral position with the neck slightly flexed; this will help the saliva drain from the mouth and prevent the tongue from  falling backward - Remove all nearby furniture and other hazards from the area - Provide verbal assurance as the individual is regaining consciousness - Provide the patient with privacy if possible - Call for help and start treatment as ordered by the caregiver   After the Seizure (Postictal Stage)  After a seizure, most patients experience confusion, fatigue, muscle pain and/or a headache. Thus, one should  permit the individual to sleep. For the next few days, reassurance is essential. Being calm and helping reorient the person is also of importance.  Most seizures are painless and end spontaneously. Seizures are not harmful to others but can lead to complications such as stress on the lungs, brain and the heart. Individuals with prior lung problems may develop labored breathing and respiratory distress.     No orders of the defined types were placed in this encounter.   No orders of the defined types were placed in this encounter.   Return in about 1 year (around 01/03/2023).    Windell Norfolk, MD 01/02/2022, 9:45 AM  [facility] [address], [address] [place], [place] [address] [phone]

## 2022-01-02 NOTE — Telephone Encounter (Signed)
I have sent work note to pt via my chart.

## 2022-01-22 ENCOUNTER — Telehealth: Payer: Self-pay | Admitting: Neurology

## 2022-01-22 NOTE — Telephone Encounter (Signed)
Pt states almost every morning she wakes to a throbbing headache, normally on the right side of head but sometimes the left. Pt states they normally don't last long.  Pt would like to discuss what she can take, please call.

## 2022-01-22 NOTE — Telephone Encounter (Signed)
Please have the patient start with Tylenol or Motrin as needed for the headaches, and if not improved within 2 weeks, to call us and schedule a follow up appointment.

## 2022-01-22 NOTE — Telephone Encounter (Signed)
Pt c/o daily morning Headaches with right sided pain/pressure. Has not tried to take OTC meds for relief.   States headaches started 2 weeks ago,  denies any seizures, new medications, or head injuries. Pt has no prior hx of headaches and has never been evaluated for morning headaches. Do you want to see pt for new problem consult or recommend she discuss with pcp?  Pt also states she will not have eeg due to cost.

## 2022-01-23 NOTE — Telephone Encounter (Signed)
Relayed message from MD, Pt voiced understanding. She will call for fu appt if needed.

## 2022-04-24 ENCOUNTER — Telehealth: Payer: Self-pay | Admitting: Neurology

## 2022-04-24 NOTE — Telephone Encounter (Signed)
I reviewed chart, we have not evaluated the pt for this problem before. We would be happy to address in either a office visit or virtual visit.   Please call back to schedule this appt.   Thank you.

## 2022-04-24 NOTE — Telephone Encounter (Signed)
Called pt. VM is full and couldn't leave a message.

## 2022-04-24 NOTE — Telephone Encounter (Signed)
Pt having headaches for 3 weeks.Usually have headaches in the morning for short periods of time. May last 10 mns in the morning and comes back sometime during the day and goes away. Wanted to let Dr. Teresa Coombs know what's going on. Can call back necessary.

## 2022-06-13 ENCOUNTER — Telehealth: Payer: Self-pay | Admitting: Neurology

## 2022-06-13 NOTE — Telephone Encounter (Signed)
Pt is calling. Wanting to ask Dr. April Manson is it okay for her to fly. Pt said she is planning a trip. Pt is requesting a call back from nurse.

## 2022-06-16 NOTE — Telephone Encounter (Signed)
Noted, please update if pt calls back. Thanks

## 2022-06-16 NOTE — Telephone Encounter (Signed)
[place] for patient to fly.

## 2022-07-24 ENCOUNTER — Encounter: Payer: Self-pay | Admitting: Radiology

## 2022-10-13 ENCOUNTER — Other Ambulatory Visit: Payer: Self-pay | Admitting: Neurology

## 2023-01-01 ENCOUNTER — Encounter: Payer: Self-pay | Admitting: Neurology

## 2023-01-01 ENCOUNTER — Ambulatory Visit (INDEPENDENT_AMBULATORY_CARE_PROVIDER_SITE_OTHER): Payer: Self-pay | Admitting: Neurology

## 2023-01-01 VITALS — BP 118/68 | HR 71 | Ht <= 58 in | Wt 144.0 lb

## 2023-01-01 DIAGNOSIS — G40909 Epilepsy, unspecified, not intractable, without status epilepticus: Secondary | ICD-10-CM

## 2023-01-01 NOTE — Patient Instructions (Signed)
Continue with Keppra 500 mg twice daily  Return in a year

## 2023-01-01 NOTE — Progress Notes (Signed)
[facility]  PATIENT: Dominique Reynolds DOB: 09-26-1960  REQUESTING CLINICIAN: Benita Stabile, MD HISTORY FROM: Patient, daughter and chart review  REASON FOR VISIT: New onset epilepsy    HISTORICAL  CHIEF COMPLAINT:  Chief Complaint  Patient presents with   New Patient (Initial Visit)    Rm 12, sz follow up, no missed medications, no sz, denies SI/HI   INTERVAL HISTORY 01/01/2023 Dominique Reynolds presents today for follow-up, she is alone.  Last visit was a year ago and since then she has not had any seizure or seizure like activity.  She is compliant with the Keppra 500 mg twice daily, denies any side effects.  No other complaints at the moment.  Overall she is doing well.   INTERVAL HISTORY 01/02/22 Patient presents today for follow-up, last visit was in May since then she denies any additional seizures.  She is compliant with Keppra 500 mg twice daily, denies any side effect from the medications.  Ambulatory EEG was ordered but has not been completed yet.  I will contact the company to set up the ambulatory EEG.  She does not have any further complaints.   HISTORY OF PRESENT ILLNESS:  This is a 62 year old woman with no reported past medical history who is presenting after a seizure.  Patient reported on the morning of April 10, she remembered getting out of her car, then felt dizzy and the next and that she remembers is waking up from the hospital, not sure if it was the same day or next morning.  Per daughter patient fell and had seizure-like activity, she was taken to the hospital, initially she was unresponsive, and was witnessed to have a second seizure in the ED.  At that time she was loaded with Keppra and Ativan.  Initial EEG did show diffuse slowing, no epileptiform discharges and her MRI brain did not show any acute abnormality.  Patient does report that 3 years ago while in [place] she had a seizure, she fell and had seizure-like activity.  She was admitted in the  hospital, daughter reported a lot of work were done including MRI and EEG but she was not started on any antiseizure medication.  Since starting the medication, she denies any side effect, report compliance and no additional seizure-like activity.  She denies any seizure risk factors.    Handedness: Right handed  Onset: 2019  Seizure Type: Unclear, probably focal  Current frequency: Only 2  Any injuries from seizures: None   Seizure risk factors: None reported   Previous ASMs: None  Currenty ASMs: Levetiracetam 500 mg twice daily  ASMs side effects: Denies  Brain Images: No acute intracranial abnormality  Previous EEGs: Diffuse slowing   OTHER MEDICAL CONDITIONS: None reported   REVIEW OF SYSTEMS: Full 14 system review of systems performed and negative with exception of: as noted in the HPI   ALLERGIES: No Known Allergies  HOME MEDICATIONS: Outpatient Medications Prior to Visit  Medication Sig Dispense Refill   levETIRAcetam (KEPPRA) 500 MG tablet TAKE 1 TABLET(500 MG) BY MOUTH TWICE DAILY 180 tablet 4   cyclobenzaprine (FLEXERIL) 10 MG tablet Take 10 mg by mouth as needed. (Patient not taking: Reported on 01/01/2023)     ibuprofen (ADVIL) 800 MG tablet Take 800 mg by mouth as needed. (Patient not taking: Reported on 01/01/2023)     No facility-administered medications prior to visit.    PAST MEDICAL HISTORY: Past Medical History:  Diagnosis Date   Seizures (HCC)  PAST SURGICAL HISTORY: Past Surgical History:  Procedure Laterality Date   REPLACEMENT TOTAL KNEE BILATERAL      FAMILY HISTORY: Family History  Problem Relation Age of Onset   Diabetes Mother    Hypertension Mother     SOCIAL HISTORY: Social History   Socioeconomic History   Marital status: Divorced    Spouse name: Not on file   Number of children: Not on file   Years of education: Not on file   Highest education level: Not on file  Occupational History   Not on file  Tobacco Use    Smoking status: Never   Smokeless tobacco: Never  Substance and Sexual Activity   Alcohol use: Not Currently   Drug use: Never   Sexual activity: Not Currently  Other Topics Concern   Not on file  Social History Narrative   Right handed   Caffeine- 1 cup daily   Lives with daughter and family   Social Determinants of Health   Financial Resource Strain: Not on file  Food Insecurity: Not on file  Transportation Needs: Not on file  Physical Activity: Not on file  Stress: Not on file  Social Connections: Not on file  Intimate Partner Violence: Not on file     PHYSICAL EXAM  GENERAL EXAM/CONSTITUTIONAL: Vitals:  Vitals:   01/01/23 1104  BP: 118/68  Pulse: 71  SpO2: 97%  Weight: 144 lb (65.3 kg)  Height: 4\' 7"  (1.397 m)   Body mass index is 33.47 kg/m. Wt Readings from Last 3 Encounters:  01/01/23 144 lb (65.3 kg)  01/02/22 135 lb 8 oz (61.5 kg)  10/23/21 130 lb (59 kg)   Patient is in no distress; well developed, nourished and groomed; neck is supple  MUSCULOSKELETAL: Gait, strength, tone, movements noted in Neurologic exam below  NEUROLOGIC: MENTAL STATUS:      No data to display         awake, alert, oriented to person, place and time recent and remote memory intact normal attention and concentration language fluent, comprehension intact, naming intact fund of knowledge appropriate  CRANIAL NERVE:  2nd, 3rd, 4th, 6th - visual fields full to confrontation, extraocular muscles intact, no nystagmus 5th - facial sensation symmetric 7th - facial strength symmetric 8th - hearing intact 9th - palate elevates symmetrically, uvula midline 11th - shoulder shrug symmetric 12th - tongue protrusion midline  MOTOR:  normal bulk and tone, full strength in the BUE, BLE  SENSORY:  normal and symmetric to light touch  COORDINATION:  finger-nose-finger, fine finger movements normal  GAIT/STATION:  normal   DIAGNOSTIC DATA (LABS, IMAGING, TESTING) - I  reviewed patient records, labs, notes, testing and imaging myself where available.  Lab Results  Component Value Date   WBC 5.1 09/11/2021   HGB 13.5 09/11/2021   HCT 40.1 09/11/2021   MCV 86 09/11/2021   PLT 285 09/11/2021      Component Value Date/Time   NA 141 09/11/2021 1017   K 4.4 09/11/2021 1017   CL 103 09/11/2021 1017   CO2 24 09/11/2021 1017   GLUCOSE 97 09/11/2021 1017   GLUCOSE 95 09/03/2021 0327   BUN 15 09/11/2021 1017   CREATININE 0.62 09/11/2021 1017   CALCIUM 9.6 09/11/2021 1017   PROT 7.2 09/11/2021 1017   ALBUMIN 4.7 09/11/2021 1017   AST 18 09/11/2021 1017   ALT 11 09/11/2021 1017   ALKPHOS 80 09/11/2021 1017   BILITOT 0.3 09/11/2021 1017   GFRNONAA >60 09/03/2021 0327  No results found for: "CHOL", "HDL", "LDLCALC", "LDLDIRECT", "TRIG" Lab Results  Component Value Date   HGBA1C 5.1 09/02/2021   No results found for: "VITAMINB12" No results found for: "TSH"  MRI Brain 09/03/21 1. No acute intracranial abnormality or etiology of seizures identified. 2. Mild-to-moderate chronic small vessel ischemic disease   EEG 09/02/21 - Continuous slow, generalized   IMPRESSION: This study is suggestive of moderate diffuse encephalopathy, nonspecific etiology. No seizures or definite epileptiform discharges were seen throughout the recording.  I personally reviewed brain Images and previous EEG reports.   ASSESSMENT AND PLAN  62 y.o. year old female  with no reported past medical history who is presenting for seizure follow up. Doing well on Keppra 500 mg BID, no additional seizures, and no side effect.    1. Seizure disorder [facility])     Patient Instructions  Continue with Keppra  500 mg twice daily  Return in a year   Per [facility] statutes, patients with seizures are not allowed to drive until they have been seizure-free for six months.  Other recommendations include using caution when using heavy equipment or power tools. Avoid working on  ladders or at heights. Take showers instead of baths.  Do not swim alone.  Ensure the water temperature is not too high on the home water heater. Do not go swimming alone. Do not lock yourself in a room alone (i.e. bathroom). When caring for infants or small children, sit down when holding, feeding, or changing them to minimize risk of injury to the child in the event you have a seizure. Maintain good sleep hygiene. Avoid alcohol.  Also recommend adequate sleep, hydration, good diet and minimize stress.   During the Seizure  - First, ensure adequate ventilation and place patients on the floor on their left side  Loosen clothing around the neck and ensure the airway is patent. If the patient is clenching the teeth, do not force the mouth open with any object as this can cause severe damage - Remove all items from the surrounding that can be hazardous. The patient may be oblivious to what's happening and may not even know what he or she is doing. If the patient is confused and wandering, either gently guide him/her away and block access to outside areas - Reassure the individual and be comforting - Call 911. In most cases, the seizure ends before EMS arrives. However, there are cases when seizures may last over 3 to 5 minutes. Or the individual may have developed breathing difficulties or severe injuries. If a pregnant patient or a person with diabetes develops a seizure, it is prudent to call an ambulance. - Finally, if the patient does not regain full consciousness, then call EMS. Most patients will remain confused for about 45 to 90 minutes after a seizure, so you must use judgment in calling for help. - Avoid restraints but make sure the patient is in a bed with padded side rails - Place the individual in a lateral position with the neck slightly flexed; this will help the saliva drain from the mouth and prevent the tongue from falling backward - Remove all nearby furniture and other hazards from the  area - Provide verbal assurance as the individual is regaining consciousness - Provide the patient with privacy if possible - Call for help and start treatment as ordered by the caregiver   After the Seizure (Postictal Stage)  After a seizure, most patients experience confusion, fatigue, muscle pain and/or a headache.  Thus, one should permit the individual to sleep. For the next few days, reassurance is essential. Being calm and helping reorient the person is also of importance.  Most seizures are painless and end spontaneously. Seizures are not harmful to others but can lead to complications such as stress on the lungs, brain and the heart. Individuals with prior lung problems may develop labored breathing and respiratory distress.     No orders of the defined types were placed in this encounter.   No orders of the defined types were placed in this encounter.   Return in about 1 year (around 01/01/2024).  I have spent a total of 30 minutes dedicated to this patient today, preparing to see patient, performing a medically appropriate examination and evaluation, ordering tests and/or medications and procedures, and counseling and educating the patient/family/caregiver; independently interpreting result and communicating results to the family/patient/caregiver; and documenting clinical information in the electronic medical record.   Windell Norfolk, MD 01/01/2023, 12:29 PM  [facility] [address] [place], [place] [address] [phone]

## 2023-03-25 IMAGING — MR MR HEAD W/O CM
14 series · 45 of 48 positions shown · non-contrast
Comparison: Head CT 09/02/2021

CLINICAL DATA: Seizure.

EXAM:
MRI HEAD WITHOUT CONTRAST
TECHNIQUE: Multiplanar, multiecho pulse sequences of the brain and surrounding
structures were obtained without intravenous contrast.

[Series 5: DWI · axial · 3.0mm · 0.88mm/px · z∈[-169,-31]mm · 5 of 96 slices shown (1 of 4)]
[im 1/96]
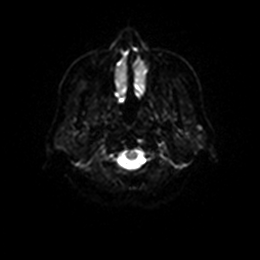
[im 24/96]
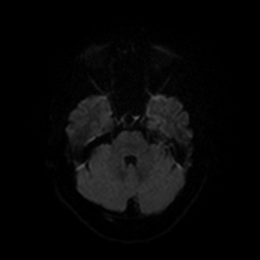
[im 48/96]
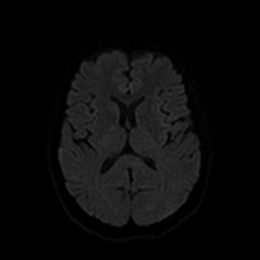
[im 72/96]
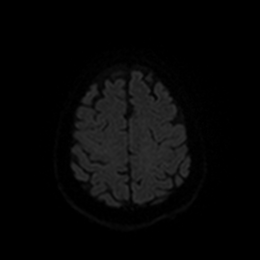
[im 96/96]
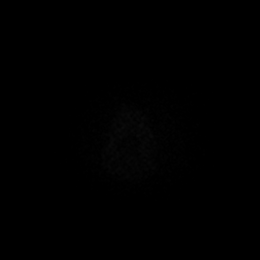

[Series 6: DWI · axial · 3.0mm · 0.88mm/px · z∈[-169,-31]mm · 3 of 47 slices shown (2 of 4)]
[im 1/47]
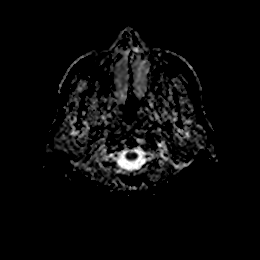
[im 24/47]
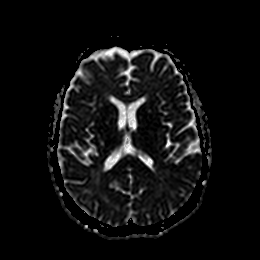
[im 47/47]
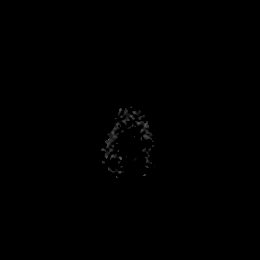

[Series 7: DWI · coronal · 4.0mm · 0.88mm/px · 4 of 64 slices shown (3 of 4)]
[im 1/64]
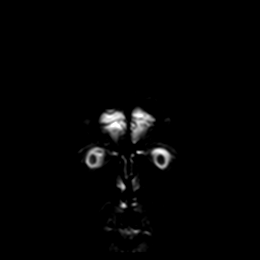
[im 22/64]
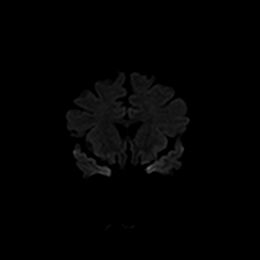
[im 43/64]
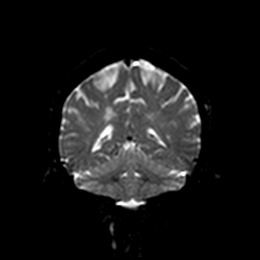
[im 64/64]
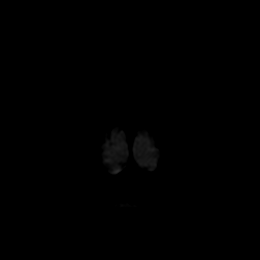

[Series 8: DWI · coronal · 4.0mm · 0.88mm/px · 2 of 32 slices shown (4 of 4)]
[im 1/32]
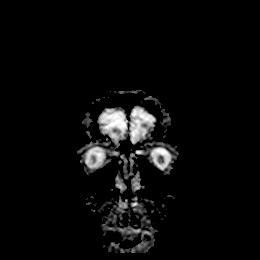
[im 32/32]
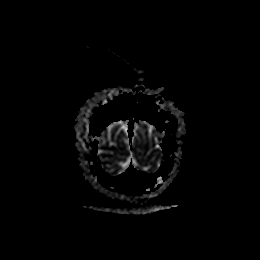

[Series 9: T1 · sagittal · 5.0mm · 0.75mm/px · 1 of 23 slices shown]
[im 1/23]
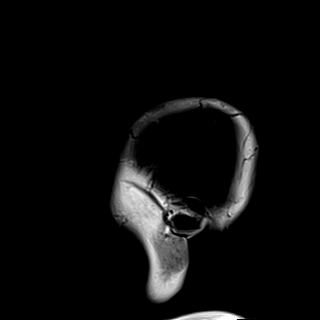

[Series 10: T2 · axial · 5.0mm · 0.72mm/px · z∈[-175,-36]mm · 2 of 25 slices shown (1 of 2)]
[im 1/25]
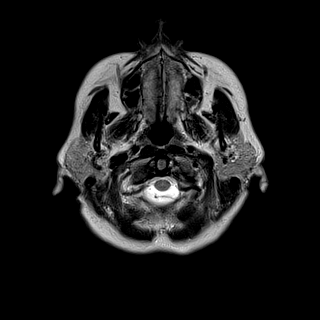
[im 25/25]
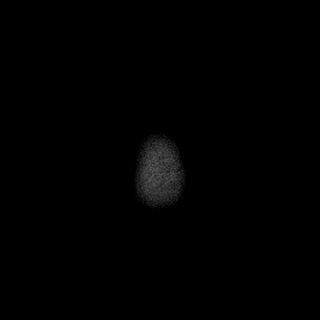

[Series 12: FLAIR · axial · 5.0mm · 0.90mm/px · z∈[-175,-36]mm · 2 of 25 slices shown (1 of 2)]
[im 1/25]
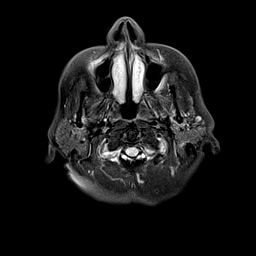
[im 25/25]
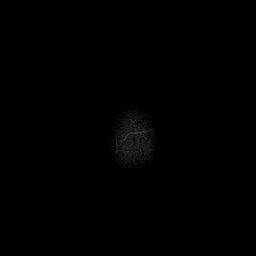

[Series 13: mag_images · axial · 3.0mm · 0.90mm/px · z∈[-179,-44]mm · 3 of 48 slices shown]
[im 1/48]
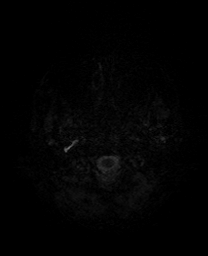
[im 24/48]
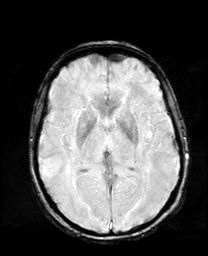
[im 48/48]
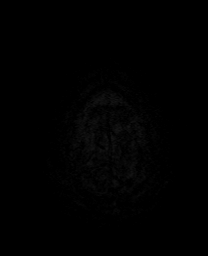

[Series 14: pha_images · axial · 3.0mm · 0.90mm/px · z∈[-179,-44]mm · 3 of 48 slices shown]
[im 1/48]
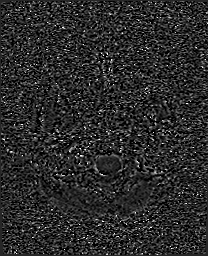
[im 24/48]
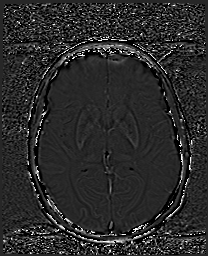
[im 48/48]
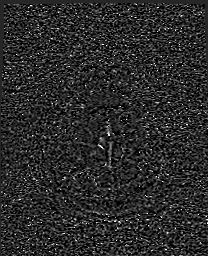

[Series 15: swi_images · axial · 3.0mm · 0.90mm/px · z∈[-179,-44]mm · 3 of 48 slices shown]
[im 1/48]
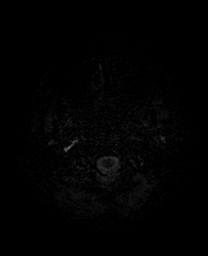
[im 24/48]
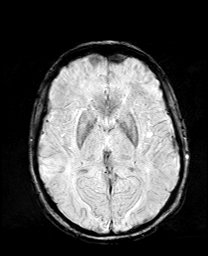
[im 48/48]
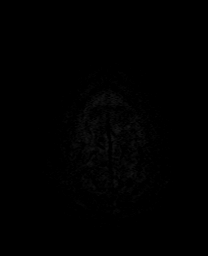

[Series 17: t1_mprage_tra_p2_iso · axial · 1.0mm · 0.98mm/px · z∈[-185,-49]mm · 8 of 144 slices shown]
[im 1/144]
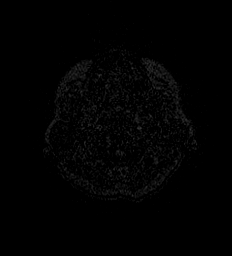
[im 18/144]
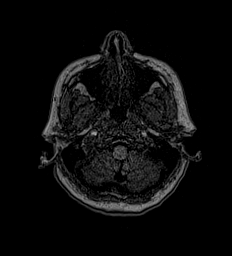
[im 36/144]
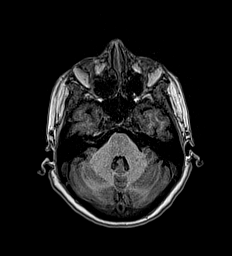
[im 54/144]
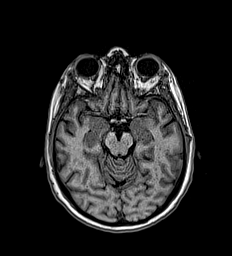
[im 90/144]
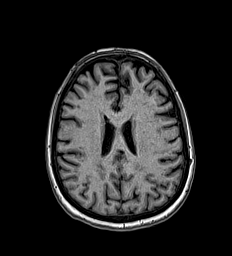
[im 108/144]
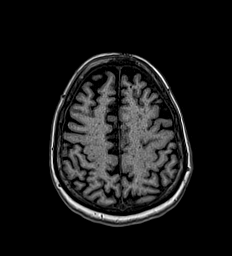
[im 126/144]
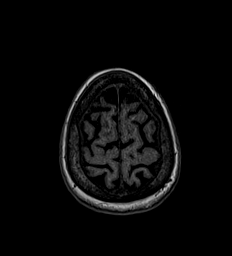
[im 144/144]
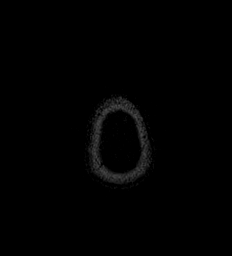

[Series 18: t1_mprage_tra_p2_iso_mpr_coronal · coronal · 1.0mm · 0.45mm/px · 5 of 120 slices shown]
[im 1/120]
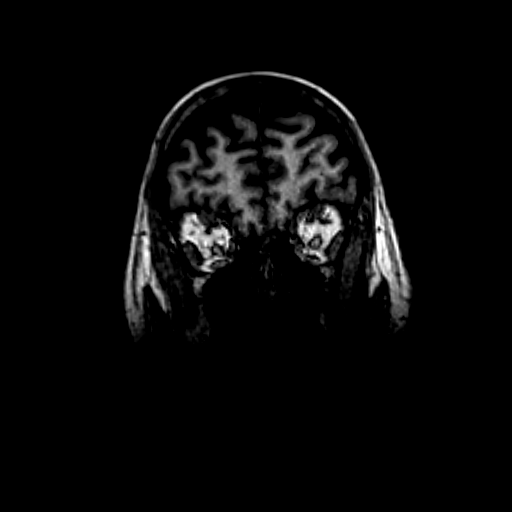
[im 20/120]
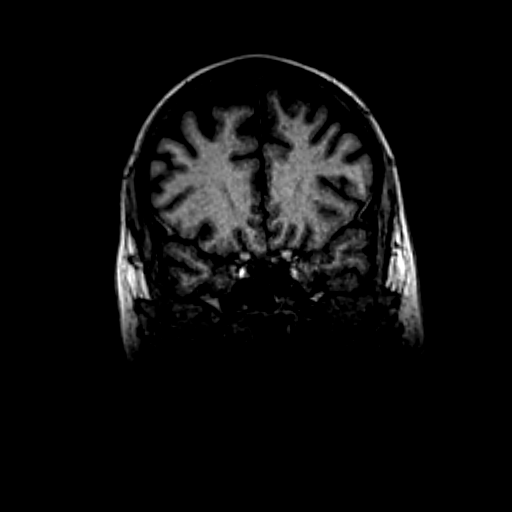
[im 40/120]
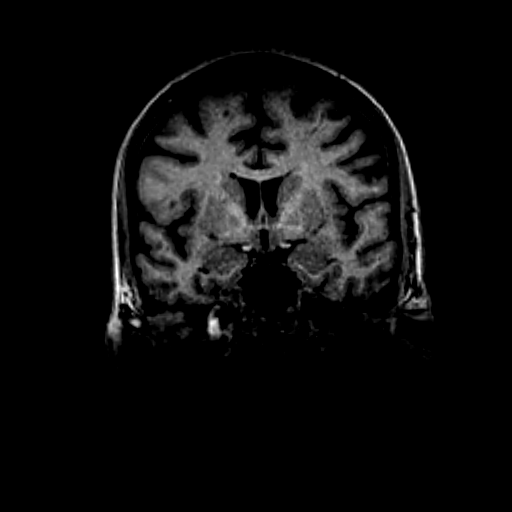
[im 60/120]
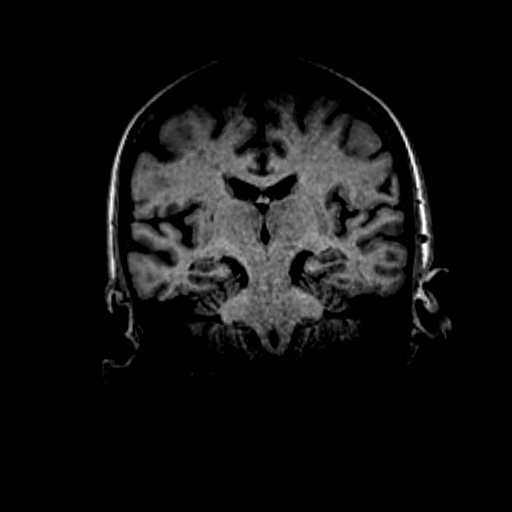
[im 80/120]
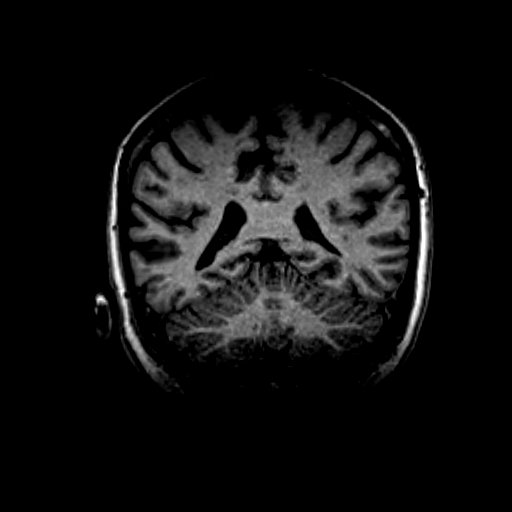

[Series 20: FLAIR · oblique · 3.0mm · 0.56mm/px · 2 of 32 slices shown (2 of 2)]
[im 1/32]
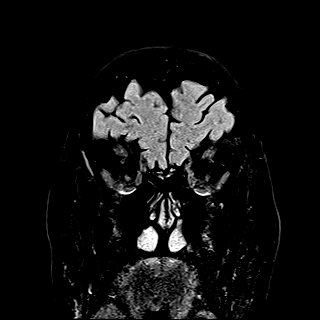
[im 32/32]
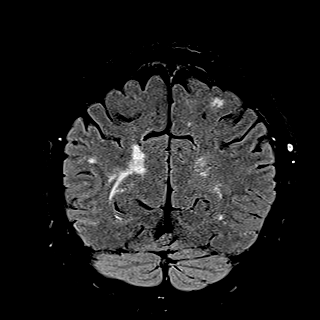

[Series 21: T2 · oblique · 3.0mm · 0.27mm/px · 2 of 32 slices shown (2 of 2)]
[im 1/32]
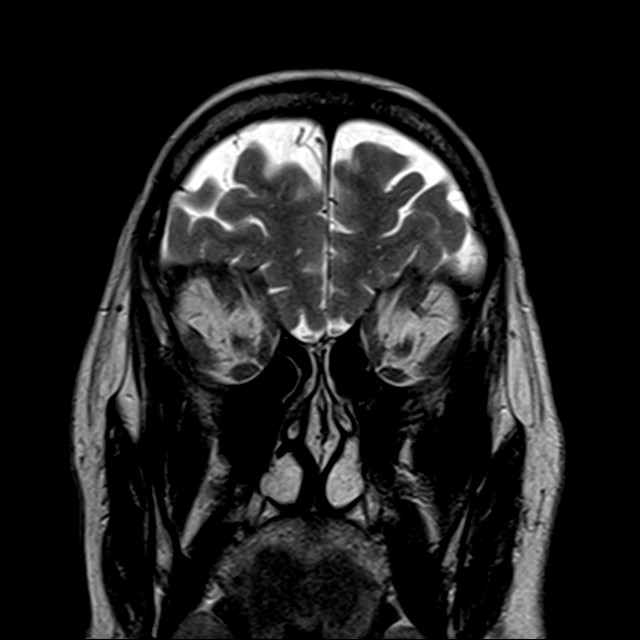
[im 32/32]
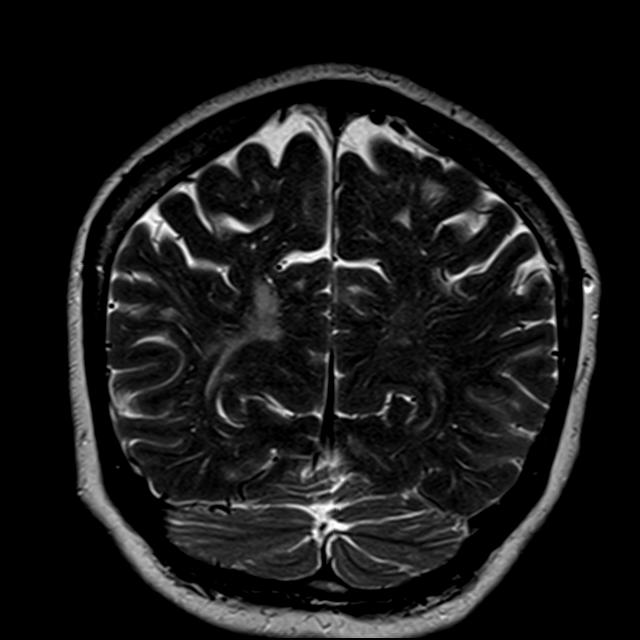

[45 of 48 positions shown; findings below may reference images not displayed]

FINDINGS: Brain: There is no evidence of an acute infarct, intracranial
hemorrhage, mass, midline shift, or extra-axial fluid collection.
The ventricles and sulci are within normal limits for age. T2
hyperintensities in the cerebral white matter bilaterally are
nonspecific but compatible with mild-to-moderate chronic small
vessel ischemic disease. A chronic lacunar infarct is noted in the
posterior limb of the right internal capsule. The hippocampi are
symmetric in size and normal in signal.

Vascular: Major intracranial vascular flow voids are preserved.

Skull and upper cervical spine: Unremarkable bone marrow signal.

Sinuses/Orbits: Unremarkable orbits. Small mucous retention cyst in
the left maxillary sinus. Clear mastoid air cells.

Other: None.
IMPRESSION: 1. No acute intracranial abnormality or etiology of seizures
identified.
2. Mild-to-moderate chronic small vessel ischemic disease.

## 2023-09-08 ENCOUNTER — Telehealth: Payer: Self-pay | Admitting: Neurology

## 2023-09-08 NOTE — Telephone Encounter (Signed)
 Call to patient, no answer. Left message to return call

## 2023-09-08 NOTE — Telephone Encounter (Signed)
 Pt called to schedule an appt due having a seizure while in [place] on 09/03/23; injury, bit my tongue pretty bad. Had left medication home, they gave me the medication in [place] Would like a call back as soon as possible. From Dr. Samara Crest.

## 2023-10-25 ENCOUNTER — Other Ambulatory Visit: Payer: Self-pay | Admitting: Neurology

## 2023-11-25 ENCOUNTER — Telehealth: Payer: Self-pay | Admitting: Neurology

## 2023-11-25 MED ORDER — LEVETIRACETAM 500 MG PO TABS: 500.0000 mg | ORAL_TABLET | Freq: Two times a day (BID) | ORAL | 4 refills | Status: DC

## 2023-11-25 NOTE — Telephone Encounter (Signed)
 Pt is requesting a refill for levETIRAcetam  (KEPPRA ) 500 MG tablet.  Pharmacy: [facility] [phone]

## 2023-11-25 NOTE — Telephone Encounter (Signed)
 Refill sent as requested. Chart reviewed

## 2024-01-06 NOTE — Progress Notes (Deleted)
 [facility]  PATIENT: Dominique Reynolds DOB: 04-May-1961  REQUESTING CLINICIAN: Shona Norleen PEDLAR, MD HISTORY FROM: Patient, daughter and chart review  REASON FOR VISIT: epilepsy    HISTORICAL  CHIEF COMPLAINT:  No chief complaint on file.  HPI:  Update 01/07/2024 JM: Patient returns for follow-up visit.  She does report a seizure back in April while in [place] left medication at home ***.   No additional seizures since. Reports compliance with Keppra  since that time and denies side effects.     History provided from Dr. Janean prior OV note for reference purposes only INTERVAL HISTORY 01/01/2023 Dominique Reynolds presents today for follow-up, she is alone.  Last visit was a year ago and since then she has not had any seizure or seizure like activity.  She is compliant with the Keppra  500 mg twice daily, denies any side effects.  No other complaints at the moment.  Overall she is doing well.   INTERVAL HISTORY 01/02/22 Patient presents today for follow-up, last visit was in May since then she denies any additional seizures.  She is compliant with Keppra  500 mg twice daily, denies any side effect from the medications.  Ambulatory EEG was ordered but has not been completed yet.  I will contact the company to set up the ambulatory EEG.  She does not have any further complaints.   HISTORY OF PRESENT ILLNESS:  This is a 63 year old woman with no reported past medical history who is presenting after a seizure.  Patient reported on the morning of April 10, she remembered getting out of her car, then felt dizzy and the next and that she remembers is waking up from the hospital, not sure if it was the same day or next morning.  Per daughter patient fell and had seizure-like activity, she was taken to the hospital, initially she was unresponsive, and was witnessed to have a second seizure in the ED.  At that time she was loaded with Keppra  and Ativan .  Initial EEG did show diffuse  slowing, no epileptiform discharges and her MRI brain did not show any acute abnormality.  Patient does report that 3 years ago while in [place]  she had a seizure, she fell and had seizure-like activity.  She was admitted in the hospital, daughter reported a lot of work were done including MRI and EEG but she was not started on any antiseizure medication.  Since starting the medication, she denies any side effect, report compliance and no additional seizure-like activity.  She denies any seizure risk factors.    Handedness: Right handed  Onset: 2019  Seizure Type: Unclear, probably focal  Current frequency: Only 2  Any injuries from seizures: None   Seizure risk factors: None reported   Previous ASMs: None  Currenty ASMs: Levetiracetam  500 mg twice daily  ASMs side effects: Denies  Brain Images: No acute intracranial abnormality  Previous EEGs: Diffuse slowing   OTHER MEDICAL CONDITIONS: None reported   REVIEW OF SYSTEMS: Full 14 system review of systems performed and negative with exception of: as noted in the HPI   ALLERGIES: No Known Allergies  HOME MEDICATIONS: Outpatient Medications Prior to Visit  Medication Sig Dispense Refill   levETIRAcetam  (KEPPRA ) 500 MG tablet Take 1 tablet (500 mg total) by mouth 2 (two) times daily. 180 tablet 4   No facility-administered medications prior to visit.    PAST MEDICAL HISTORY: Past Medical History:  Diagnosis Date   Seizures (HCC)     PAST SURGICAL HISTORY: Past  Surgical History:  Procedure Laterality Date   REPLACEMENT TOTAL KNEE BILATERAL      FAMILY HISTORY: Family History  Problem Relation Age of Onset   Diabetes Mother    Hypertension Mother     SOCIAL HISTORY: Social History   Socioeconomic History   Marital status: Divorced    Spouse name: Not on file   Number of children: Not on file   Years of education: Not on file   Highest education level: Not on file  Occupational History   Not on file   Tobacco Use   Smoking status: Never   Smokeless tobacco: Never  Substance and Sexual Activity   Alcohol use: Not Currently   Drug use: Never   Sexual activity: Not Currently  Other Topics Concern   Not on file  Social History Narrative   Right handed   Caffeine- 1 cup daily   Lives with daughter and family   Social Drivers of Corporate investment banker Strain: Not on file  Food Insecurity: Not on file  Transportation Needs: Not on file  Physical Activity: Not on file  Stress: Not on file  Social Connections: Not on file  Intimate Partner Violence: Not on file     PHYSICAL EXAM  GENERAL EXAM/CONSTITUTIONAL: Vitals:  There were no vitals filed for this visit.  There is no height or weight on file to calculate BMI. Wt Readings from Last 3 Encounters:  01/01/23 144 lb (65.3 kg)  01/02/22 135 lb 8 oz (61.5 kg)  10/23/21 130 lb (59 kg)   Patient is in no distress; well developed, nourished and groomed; neck is supple  MUSCULOSKELETAL: Gait, strength, tone, movements noted in Neurologic exam below  NEUROLOGIC: MENTAL STATUS:      No data to display         awake, alert, oriented to person, place and time recent and remote memory intact normal attention and concentration language fluent, comprehension intact, naming intact fund of knowledge appropriate  CRANIAL NERVE:  2nd, 3rd, 4th, 6th - visual fields full to confrontation, extraocular muscles intact, no nystagmus 5th - facial sensation symmetric 7th - facial strength symmetric 8th - hearing intact 9th - palate elevates symmetrically, uvula midline 11th - shoulder shrug symmetric 12th - tongue protrusion midline  MOTOR:  normal bulk and tone, full strength in the BUE, BLE  SENSORY:  normal and symmetric to light touch  COORDINATION:  finger-nose-finger, fine finger movements normal  GAIT/STATION:  normal   DIAGNOSTIC DATA (LABS, IMAGING, TESTING) - I reviewed patient records, labs, notes,  testing and imaging myself where available.  Lab Results  Component Value Date   WBC 5.1 09/11/2021   HGB 13.5 09/11/2021   HCT 40.1 09/11/2021   MCV 86 09/11/2021   PLT 285 09/11/2021      Component Value Date/Time   NA 141 09/11/2021 1017   K 4.4 09/11/2021 1017   CL 103 09/11/2021 1017   CO2 24 09/11/2021 1017   GLUCOSE 97 09/11/2021 1017   GLUCOSE 95 09/03/2021 0327   BUN 15 09/11/2021 1017   CREATININE 0.62 09/11/2021 1017   CALCIUM 9.6 09/11/2021 1017   PROT 7.2 09/11/2021 1017   ALBUMIN 4.7 09/11/2021 1017   AST 18 09/11/2021 1017   ALT 11 09/11/2021 1017   ALKPHOS 80 09/11/2021 1017   BILITOT 0.3 09/11/2021 1017   GFRNONAA >60 09/03/2021 0327   No results found for: CHOL, HDL, LDLCALC, LDLDIRECT, TRIG Lab Results  Component Value Date  HGBA1C 5.1 09/02/2021   No results found for: VITAMINB12 No results found for: TSH  MRI Brain 09/03/21 1. No acute intracranial abnormality or etiology of seizures identified. 2. Mild-to-moderate chronic small vessel ischemic disease   EEG 09/02/21 - Continuous slow, generalized   IMPRESSION: This study is suggestive of moderate diffuse encephalopathy, nonspecific etiology. No seizures or definite epileptiform discharges were seen throughout the recording.  I personally reviewed brain Images and previous EEG reports.   ASSESSMENT AND PLAN  63 y.o. year old female  with no reported past medical history who is presenting for seizure follow up. Recurrent seizure in 08/2023 likely provoked from medication noncompliance.    Seizure disorder Continue Keppra  500 mg twice daily -refill provided Discussed importance of medication compliance and avoidance of seizure provoking triggers Advised to call with any recurrent seizure activity         I personally spent a total of *** minutes in the care of the patient today including {Time Based Coding:210964241}.  Harlene Bogaert, AGNP-BC  [facility] [address] [place], [place] [address]  Phone [phone] Fax [phone] Note: This document was prepared with digital dictation and possible smart phrase technology. Any transcriptional errors that result from this process are unintentional.

## 2024-01-07 ENCOUNTER — Ambulatory Visit: Payer: Self-pay | Admitting: Adult Health

## 2024-01-07 ENCOUNTER — Encounter: Payer: Self-pay | Admitting: Adult Health

## 2024-01-15 ENCOUNTER — Encounter: Payer: Self-pay | Admitting: Radiology

## 2024-02-01 ENCOUNTER — Telehealth: Payer: Self-pay | Admitting: Neurology

## 2024-02-01 NOTE — Telephone Encounter (Signed)
 Patient calling moving out of [place]  would like to see Dr. Gregg before moving to [place], [place] . Also would like to request medical records to take with me. Would like a call from medical records to discuss what to do to get medical records. Have scheduled an appt with Dr. Gregg on 02/04/24 due will be moving on 04/08/24

## 2024-02-04 ENCOUNTER — Encounter: Payer: Self-pay | Admitting: Neurology

## 2024-02-04 ENCOUNTER — Ambulatory Visit: Payer: Self-pay | Admitting: Neurology

## 2024-03-28 ENCOUNTER — Encounter: Payer: Self-pay | Admitting: Radiology

## 2024-04-07 ENCOUNTER — Telehealth: Payer: Self-pay | Admitting: Neurology

## 2024-04-07 NOTE — Telephone Encounter (Signed)
 Patient ask to schedule appointment with Dr. Gregg, but there were no availability until 10/2024. Patient decided to be transferred to Medical Records due to will be moving to [place]  and would like to take medical records with her.

## 2024-04-07 NOTE — Telephone Encounter (Signed)
 Pt's daughter inquired about an appointment, daughter then connected to billing about bad debt bal

## 2024-04-08 ENCOUNTER — Ambulatory Visit: Payer: Self-pay | Admitting: Adult Health

## 2024-04-16 ENCOUNTER — Telehealth: Payer: Self-pay | Admitting: Neurology

## 2024-04-16 ENCOUNTER — Other Ambulatory Visit: Payer: Self-pay

## 2024-04-16 ENCOUNTER — Emergency Department (HOSPITAL_COMMUNITY)
Admission: EM | Admit: 2024-04-16 | Discharge: 2024-04-16 | Disposition: A | Discharge status: Home / Self Care | Attending: Emergency Medicine | Admitting: Emergency Medicine

## 2024-04-16 ENCOUNTER — Encounter (HOSPITAL_COMMUNITY): Payer: Self-pay | Admitting: *Deleted

## 2024-04-16 DIAGNOSIS — R569 Unspecified convulsions: Secondary | ICD-10-CM | POA: Diagnosis present

## 2024-04-16 DIAGNOSIS — G40909 Epilepsy, unspecified, not intractable, without status epilepticus: Secondary | ICD-10-CM | POA: Insufficient documentation

## 2024-04-16 LAB — COMPREHENSIVE METABOLIC PANEL WITH GFR
ALT: 12 U/L (ref 0–44)
AST: 21 U/L (ref 15–41)
Albumin: 4.2 g/dL (ref 3.5–5.0)
Alkaline Phosphatase: 69 U/L (ref 38–126)
Anion gap: 14 (ref 5–15)
BUN: 17 mg/dL (ref 8–23)
CO2: 21 mmol/L — ABNORMAL LOW (ref 22–32)
Calcium: 8.5 mg/dL — ABNORMAL LOW (ref 8.9–10.3)
Chloride: 106 mmol/L (ref 98–111)
Creatinine, Ser: 0.61 mg/dL (ref 0.44–1.00)
GFR, Estimated: >60 mL/min (ref >60)
Glucose, Bld: 127 mg/dL — ABNORMAL HIGH (ref 70–99)
Potassium: 3.4 mmol/L — ABNORMAL LOW (ref 3.5–5.1)
Sodium: 141 mmol/L (ref 135–145)
Total Bilirubin: 0.3 mg/dL (ref 0.0–1.2)
Total Protein: 6.7 g/dL (ref 6.5–8.1)

## 2024-04-16 LAB — CBC WITH DIFFERENTIAL/PLATELET
Abs Immature Granulocytes: 0.1 K/uL — ABNORMAL HIGH (ref 0.00–0.07)
Basophils Absolute: 0 K/uL (ref 0.0–0.1)
Basophils Relative: 0 %
Eosinophils Absolute: 0 K/uL (ref 0.0–0.5)
Eosinophils Relative: 1 %
HCT: 35.1 % — ABNORMAL LOW (ref 36.0–46.0)
Hemoglobin: 11.4 g/dL — ABNORMAL LOW (ref 12.0–15.0)
Immature Granulocytes: 1 %
Lymphocytes Relative: 13 %
Lymphs Abs: 1.1 K/uL (ref 0.7–4.0)
MCH: 28.5 pg (ref 26.0–34.0)
MCHC: 32.5 g/dL (ref 30.0–36.0)
MCV: 87.8 fL (ref 80.0–100.0)
Monocytes Absolute: 0.6 K/uL (ref 0.1–1.0)
Monocytes Relative: 6 %
Neutro Abs: 6.9 K/uL (ref 1.7–7.7)
Neutrophils Relative %: 79 %
Platelets: 215 K/uL (ref 150–400)
RBC: 4 MIL/uL (ref 3.87–5.11)
RDW: 13 % (ref 11.5–15.5)
WBC: 8.7 K/uL (ref 4.0–10.5)
nRBC: 0 % (ref 0.0–0.2)

## 2024-04-16 LAB — CBG MONITORING, ED: Glucose-Capillary: 177 mg/dL — ABNORMAL HIGH (ref 70–99)

## 2024-04-16 LAB — URINALYSIS, ROUTINE W REFLEX MICROSCOPIC
Bilirubin Urine: NEGATIVE
Glucose, UA: NEGATIVE mg/dL
Hgb urine dipstick: NEGATIVE
Ketones, ur: 20 mg/dL — AB
Leukocytes,Ua: NEGATIVE
Nitrite: NEGATIVE
Protein, ur: NEGATIVE mg/dL
Specific Gravity, Urine: 1.017 (ref 1.005–1.030)
pH: 5 (ref 5.0–8.0)

## 2024-04-16 MED ORDER — LEVETIRACETAM 750 MG PO TABS: 750.0000 mg | ORAL_TABLET | Freq: Two times a day (BID) | ORAL | 0 refills | Status: AC

## 2024-04-16 MED ORDER — LEVETIRACETAM (KEPPRA) 500 MG/5 ML ADULT IV PUSH
1500.0000 mg | Freq: Once | INTRAVENOUS | Status: AC
Start: 2024-04-16 — End: 2024-04-16
  Administered 2024-04-16: 1500 mg via INTRAVENOUS
  Filled 2024-04-16: qty 15

## 2024-04-16 MED ORDER — LORAZEPAM 2 MG/ML IJ SOLN
1.0000 mg | Freq: Once | INTRAMUSCULAR | Status: AC
  Administered 2024-04-16: 1 mg via INTRAVENOUS
  Filled 2024-04-16: qty 1

## 2024-04-16 NOTE — Telephone Encounter (Signed)
 Patient sent a message to the answering service reporting spasms, feeling like something is jumping inside. She reports that she is compliant with her Keppra .  I called her, no answer, unable to leave message (voicemail is full). My direction was to take an additional dose of Keppra  now and to increase her dose to 750 mg BID.   Dr. Nidia Grogan

## 2024-04-16 NOTE — ED Triage Notes (Signed)
 Pt BIB [facility] for seizures, witnessed by family. Hx of seizures, + bit tongue, tachycardia per report, slightly confused still- knows she is in hospital but not age or year. Pt missed her seizure medication last night.

## 2024-04-16 NOTE — ED Notes (Signed)
 Pt/family received d/c paperwork at this time. After going over the paperwork any questions, comments, or concerns were answered to the best of this nurse's knowledge. The pt/family verbally acknowledged the teachings/instructions.

## 2024-04-16 NOTE — ED Provider Notes (Signed)
 [place] EMERGENCY DEPARTMENT AT [facility] Provider Note   CSN: [id number] Arrival date & time: 04/16/24  1154     Patient presents with: Seizures   Dominique Reynolds is a 63 y.o. female.    Seizures    This patient is a 63 year old female presenting to the hospital with a seizure.  This patient has a history of seizure disorder for which she takes levetiracetam , according to the report of the paramedics and the family members the patient did not take her medication last night as the medicine was still in the pillbox, she was last seen in August of last year by neurology, they noted in their notes that the patient who had recently been diagnosed with seizures within the last year or so.  She had an MRI which was unremarkable reported that she possibly had a seizure several years prior when she lived in [place] , had not been started on any seizure medications until last year, currently taking Keppra  and was supposed to be taking 500 mg twice daily based on the notes from August of last year.  Interestingly there was a phone call to the neurology office today approximately 15 minutes ago where the patient had reported having spasms feeling like something is jumping inside and had reported that she was compliant with her Keppra , they were not able to get a hold of her when they called back  Evidently the patient had a witnessed tonic-clonic seizure while she was trying to give grandchildren a bath, the patient bit her tongue, she was postictal on EMS arrival with mild tachycardia, the patient is not able to answer any significant questions for me but is alert and following some simple commands at this time.  Paramedics did not give any medication prehospital  Prior to Admission medications   Medication Sig Start Date End Date Taking? Authorizing Provider  levETIRAcetam  (KEPPRA ) 750 MG tablet Take 1 tablet (750 mg total) by mouth 2 (two) times daily. 04/16/24 06/15/24  Cleotilde Rogue, MD    Allergies: Patient has no known allergies.    Review of Systems  Neurological:  Positive for seizures.    Updated Vital Signs BP 134/68   Pulse 95   Temp 98.1 F (36.7 C) (Oral)   Resp 17   SpO2 97%   Physical Exam Vitals and nursing note reviewed.  Constitutional:      General: She is not in acute distress.    Appearance: She is well-developed.  HENT:     Head: Normocephalic and atraumatic.     Comments: Evidence of some tongue biting, no lacerations to repair    Mouth/Throat:     Pharynx: No oropharyngeal exudate.  Eyes:     General: No scleral icterus.       Right eye: No discharge.        Left eye: No discharge.     Conjunctiva/sclera: Conjunctivae normal.     Pupils: Pupils are equal, round, and reactive to light.  Neck:     Thyroid: No thyromegaly.     Vascular: No JVD.  Cardiovascular:     Rate and Rhythm: Normal rate and regular rhythm.     Heart sounds: Normal heart sounds. No murmur heard.    No friction rub. No gallop.  Pulmonary:     Effort: Pulmonary effort is normal. No respiratory distress.     Breath sounds: Normal breath sounds. No wheezing or rales.  Abdominal:     General: Bowel sounds are normal. There  is no distension.     Palpations: Abdomen is soft. There is no mass.     Tenderness: There is no abdominal tenderness.  Musculoskeletal:        General: No tenderness. Normal range of motion.     Cervical back: Normal range of motion and neck supple.     Right lower leg: No edema.     Left lower leg: No edema.  Lymphadenopathy:     Cervical: No cervical adenopathy.  Skin:    General: Skin is warm and dry.     Findings: No erythema or rash.  Neurological:     Mental Status: She is alert.     Coordination: Coordination normal.     Comments: No facial droop, answers questions with simple 1 or 2 word answers, she speaks quietly, she is able to follow simple commands with arms and legs  Psychiatric:        Behavior: Behavior  normal.     (all labs ordered are listed, but only abnormal results are displayed) Labs Reviewed  COMPREHENSIVE METABOLIC PANEL WITH GFR - Abnormal; Notable for the following components:      Result Value   Potassium 3.4 (*)    CO2 21 (*)    Glucose, Bld 127 (*)    Calcium 8.5 (*)    All other components within normal limits  CBC WITH DIFFERENTIAL/PLATELET - Abnormal; Notable for the following components:   Hemoglobin 11.4 (*)    HCT 35.1 (*)    Abs Immature Granulocytes 0.10 (*)    All other components within normal limits  URINALYSIS, ROUTINE W REFLEX MICROSCOPIC - Abnormal; Notable for the following components:   Ketones, ur 20 (*)    All other components within normal limits  CBG MONITORING, ED - Abnormal; Notable for the following components:   Glucose-Capillary 177 (*)    All other components within normal limits    EKG: None  Radiology: No results found.   Procedures   Medications Ordered in the ED  levETIRAcetam  (KEPPRA ) undiluted injection 1,500 mg (1,500 mg Intravenous Given 04/16/24 1210)  LORazepam  (ATIVAN ) injection 1 mg (1 mg Intravenous Given 04/16/24 1209)                                    Medical Decision Making Amount and/or Complexity of Data Reviewed Labs: ordered.  Risk Prescription drug management.   Patient has had a seizure, possibly noncompliant with medications, awaiting family arrival.  She appears stable at this time mildly postictal with unremarkable vital signs and a blood sugar that is over 100  Labs:  I  personally viewed and interpreted the labs which show urinalysis unremarkable, metabolic panel reassuring and CBC is also unremarkable.  Glucose was normal  Imaging not indicated  Meds: The patient was given Ativan  and Keppra , no further seizure activity, the patient states that she thinks that she probably did take her medicine but is not exactly clear.  Will increase dose to 750 mg twice daily and have her follow-up closely.   Patient stable for discharge with unremarkable vital signs      Final diagnoses:  Seizure [facility])    ED Discharge Orders          Ordered    levETIRAcetam  (KEPPRA ) 750 MG tablet  2 times daily        04/16/24 1401  Cleotilde Rogue, MD 04/16/24 [phone]

## 2024-04-16 NOTE — Discharge Instructions (Signed)
 Make sure that you are taking your Keppra  exactly as prescribed.  I would like you to increase your dose to 750 mg twice a day, follow-up with your neurologist this week.  Thank you for allowing us  to treat you in the emergency department today.  After reviewing your examination and potential testing that was done it appears that you are safe to go home.  I would like for you to follow-up with your doctor within the next several days, have them obtain your records and follow-up with them to review all potential tests and results from your visit.  If you should develop severe or worsening symptoms return to the emergency department immediately

## 2024-04-16 NOTE — ED Notes (Signed)
 Pt states she took her morning dose of Keppra  but not her evening dose

## 2024-04-20 ENCOUNTER — Encounter (INDEPENDENT_AMBULATORY_CARE_PROVIDER_SITE_OTHER): Payer: Self-pay | Admitting: *Deleted
